# Patient Record
Sex: Male | Born: 1950
Health system: Southern US, Community
[De-identification: ages and names within clinical notes are randomized; demographics above are authoritative.]

## PROBLEM LIST (undated history)

## (undated) DIAGNOSIS — Z87448 Personal history of other diseases of urinary system: Secondary | ICD-10-CM

## (undated) DIAGNOSIS — I10 Essential (primary) hypertension: Secondary | ICD-10-CM

## (undated) DIAGNOSIS — J45909 Unspecified asthma, uncomplicated: Secondary | ICD-10-CM

## (undated) DIAGNOSIS — M1990 Unspecified osteoarthritis, unspecified site: Secondary | ICD-10-CM

## (undated) DIAGNOSIS — E785 Hyperlipidemia, unspecified: Secondary | ICD-10-CM

## (undated) HISTORY — DX: Unspecified asthma, uncomplicated: J45.909

## (undated) HISTORY — DX: Unspecified osteoarthritis, unspecified site: M19.90

## (undated) HISTORY — PX: COLON SURGERY: SHX602

## (undated) HISTORY — DX: Personal history of other diseases of urinary system: Z87.448

## (undated) HISTORY — DX: Hyperlipidemia, unspecified: E78.5

---

## 2012-05-16 ENCOUNTER — Ambulatory Visit (INDEPENDENT_AMBULATORY_CARE_PROVIDER_SITE_OTHER): Payer: BC Managed Care – PPO | Admitting: Emergency Medicine

## 2012-05-16 VITALS — BP 132/79 | HR 85 | Temp 98.5°F | Resp 16 | Ht 65.5 in | Wt 187.0 lb

## 2012-05-16 DIAGNOSIS — M779 Enthesopathy, unspecified: Secondary | ICD-10-CM

## 2012-05-16 MED ORDER — NAPROXEN SODIUM 550 MG PO TABS: 550.0000 mg | ORAL_TABLET | Freq: Two times a day (BID) | ORAL | Status: DC

## 2012-05-16 NOTE — Progress Notes (Signed)
[facility] [address], [place] [address] [phone]  Date:  05/16/2012   Name:  Johnny Sheppard   DOB:  Jul 06, 1950   MRN:  [id number]  PCP:  No primary provider on file.    Chief Complaint: Hand Pain   History of Present Illness:  John[place]ner is a 61 y.o. very pleasant male patient who presents with the following:  Injured his right hand pulling on a garbage can full of scrap metal.  Says he felt something pop and experienced pain.  Now cannot fully close his fist.  There is no problem list on file for this patient.   Past Medical History  Diagnosis Date  . Asthma     History reviewed. No pertinent past surgical history.  History  Substance Use Topics  . Smoking status: Never Smoker   . Smokeless tobacco: Not on file  . Alcohol Use: No    History reviewed. No pertinent family history.  No Known Allergies  Medication list has been reviewed and updated.  Current Outpatient Prescriptions on File Prior to Visit  Medication Sig Dispense Refill  . simvastatin (ZOCOR) 20 MG tablet Take 20 mg by mouth every evening.        Review of Systems:  As per HPI, otherwise negative.    Physical Examination: Filed Vitals:   05/16/12 1510  BP: 132/79  Pulse: 85  Temp: 98.5 F (36.9 C)  Resp: 16   Filed Vitals:   05/16/12 1510  Height: 5' 5.5" (1.664 m)  Weight: 187 lb (84.823 kg)   Body mass index is 30.65 kg/(m^2). Ideal Body Weight: Weight in (lb) to have BMI = 25: 152.2    GEN: WDWN, NAD, Non-toxic, Alert & Oriented x 3 HEENT: Atraumatic, Normocephalic.  Ears and Nose: No external deformity. EXTR: No clubbing/cyanosis/edema NEURO: Normal gait.  PSYCH: Normally interactive. Conversant. Not depressed or anxious appearing.  Calm demeanor.  RIGHT HAND:  Tender over flexor tendon in palm 4th metacarpal.  No crepitus.  Pain with flexion against resistance.  Has full active ROM.    Assessment and Plan: Tendonitis Anaprox Hand  consult Follow up as needed  Carmelina Dane, MD

## 2012-05-30 ENCOUNTER — Ambulatory Visit (INDEPENDENT_AMBULATORY_CARE_PROVIDER_SITE_OTHER): Payer: BC Managed Care – PPO | Admitting: Family Medicine

## 2012-05-30 VITALS — BP 127/81 | HR 78 | Temp 98.7°F | Resp 16 | Ht 65.0 in | Wt 187.2 lb

## 2012-05-30 DIAGNOSIS — E785 Hyperlipidemia, unspecified: Secondary | ICD-10-CM

## 2012-05-30 DIAGNOSIS — R109 Unspecified abdominal pain: Secondary | ICD-10-CM

## 2012-05-30 LAB — POCT UA - MICROSCOPIC ONLY
Casts, Ur, LPF, POC: NEGATIVE
Crystals, Ur, HPF, POC: NEGATIVE
Epithelial cells, urine per micros: NEGATIVE
Mucus, UA: NEGATIVE
WBC, Ur, HPF, POC: NEGATIVE
Yeast, UA: NEGATIVE

## 2012-05-30 LAB — POCT CBC
Granulocyte percent: 50.3 % (ref 37–80)
HCT, POC: 48.3 % (ref 43.5–53.7)
Hemoglobin: 15.7 g/dL (ref 14.1–18.1)
Lymph, poc: 4.1 — AB (ref 0.6–3.4)
MCH, POC: 29.2 pg (ref 27–31.2)
MCHC: 32.5 g/dL (ref 31.8–35.4)
MCV: 90 fL (ref 80–97)
MID (cbc): 0.9 (ref 0–0.9)
MPV: 9.3 fL (ref 0–99.8)
POC Granulocyte: 5 (ref 2–6.9)
POC LYMPH PERCENT: 40.8 % (ref 10–50)
POC MID %: 8.9 %M (ref 0–12)
Platelet Count, POC: 351 10*3/uL (ref 142–424)
RBC: 5.37 M/uL (ref 4.69–6.13)
RDW, POC: 13.1 %
WBC: 10 10*3/uL (ref 4.6–10.2)

## 2012-05-30 LAB — POCT URINALYSIS DIPSTICK
Bilirubin, UA: NEGATIVE
Glucose, UA: NEGATIVE
Leukocytes, UA: NEGATIVE
Nitrite, UA: NEGATIVE
Protein, UA: NEGATIVE
Spec Grav, UA: >=1.03
Urobilinogen, UA: 0.2
pH, UA: 5.5

## 2012-05-30 NOTE — Progress Notes (Signed)
[facility]:  Office Visit  Chief Complaint:  Chief Complaint  Patient presents with  . Flank Pain    been hurting on right side of back for two days    HPI: Johnny Sheppard is a 62 y.o. male who complains of  1 day history of right side flank pain, uncomfortable. Urine with odor. No fevers or chills.  Denies nausea, vomiting. No increase frequency/urgency. Empties completely when goes to bathroom.  Drinking echo water, vitamin water.   Hyperlipidemia-doing well on statins. No SEs. Would like to get labs redone, it has been 1 year since and he has not followed up.   Past Medical History  Diagnosis Date  . Asthma   . Arthritis   . Hyperlipidemia   . H/O hematuria     Followed by urology   Past Surgical History  Procedure Date  . Colon surgery    History   Social History  . Marital Status: Married    Spouse Name: N/A    Number of Children: N/A  . Years of Education: N/A   Social History Main Topics  . Smoking status: Never Smoker   . Smokeless tobacco: None  . Alcohol Use: No  . Drug Use: No  . Sexually Active: Yes   Other Topics Concern  . None   Social History Narrative  . None   History reviewed. No pertinent family history. No Known Allergies Prior to Admission medications   Medication Sig Start Date End Date Taking? Authorizing Provider  aspirin 81 MG tablet Take 81 mg by mouth daily.   Yes Historical Provider, MD  naproxen sodium (ANAPROX DS) 550 MG tablet Take 1 tablet (550 mg total) by mouth 2 (two) times daily with a meal. 05/16/12 05/16/13 Yes Phillips Odor, MD  simvastatin (ZOCOR) 20 MG tablet Take 20 mg by mouth every evening.   Yes Historical Provider, MD     ROS: The patient denies fevers, chills, night sweats, unintentional weight loss, chest pain, palpitations, wheezing, dyspnea on exertion, nausea, vomiting, abdominal pain, dysuria, hematuria, melena, numbness, weakness, or tingling.    All other systems have been  reviewed and were otherwise negative with the exception of those mentioned in the HPI and as above.    PHYSICAL EXAM: Filed Vitals:   05/30/12 1706  BP: 127/81  Pulse: 78  Temp: 98.7 F (37.1 C)  Resp: 16   Filed Vitals:   05/30/12 1706  Height: 5\' 5"  (1.651 m)  Weight: 187 lb 3.2 oz (84.913 kg)   Body mass index is 31.15 kg/(m^2).  General: Alert, no acute distress HEENT:  Normocephalic, atraumatic, oropharynx patent.  Cardiovascular:  Regular rate and rhythm, no rubs murmurs or gallops.  No Carotid bruits, radial pulse intact. No pedal edema.  Respiratory: Clear to auscultation bilaterally.  No wheezes, rales, or rhonchi.  No cyanosis, no use of accessory musculature GI: No organomegaly, abdomen is soft and non-tender, positive bowel sounds.  No masses. Skin: No rashes. Neurologic: Facial musculature symmetric. Psychiatric: Patient is appropriate throughout our interaction. Lymphatic: No cervical lymphadenopathy Musculoskeletal: Gait intact. No CVA tenderness   LABS: Results for orders placed in visit on 05/30/12  POCT UA - MICROSCOPIC ONLY      Component Value Range   WBC, Ur, HPF, POC neg     RBC, urine, microscopic 0-1     Bacteria, U Microscopic trace     Mucus, UA neg     Epithelial cells, urine per micros neg  Crystals, Ur, HPF, POC neg     Casts, Ur, LPF, POC neg     Yeast, UA neg    POCT URINALYSIS DIPSTICK      Component Value Range   Color, UA yellow     Clarity, UA clear     Glucose, UA neg     Bilirubin, UA neg     Ketones, UA trace     Spec Grav, UA >=1.030     Blood, UA trace-lysed     pH, UA 5.5     Protein, UA neg     Urobilinogen, UA 0.2     Nitrite, UA neg     Leukocytes, UA Negative    POCT CBC      Component Value Range   WBC 10.0  4.6 - 10.2 K/uL   Lymph, poc 4.1 (*) 0.6 - 3.4   POC LYMPH PERCENT 40.8  10 - 50 %L   MID (cbc) 0.9  0 - 0.9   POC MID % 8.9  0 - 12 %M   POC Granulocyte 5.0  2 - 6.9   Granulocyte percent 50.3  37 -  80 %G   RBC 5.37  4.69 - 6.13 M/uL   Hemoglobin 15.7  14.1 - 18.1 g/dL   HCT, POC 29.5  28.4 - 53.7 %   MCV 90.0  80 - 97 fL   MCH, POC 29.2  27 - 31.2 pg   MCHC 32.5  31.8 - 35.4 g/dL   RDW, POC 13.2     Platelet Count, POC 351  142 - 424 K/uL   MPV 9.3  0 - 99.8 fL     EKG/XRAY:   Primary read interpreted by Dr. Conley Rolls at [facility].   ASSESSMENT/PLAN: Encounter Diagnoses  Name Primary?  . Flank pain, acute Yes  . Hyperlipidemia    Most likely small kidney stone vs msk sprain and strain, no e/o UTI Will monitor, if sxs worsen then will give cipro 250 mg BID for 3 days Pending labs from today: CMP  He will return for fasting labs for high cholesterol: TSH lipid F/u in 6 months    Johnny Oelkers PHUONG, DO 05/31/2012 12:09 PM

## 2012-05-31 ENCOUNTER — Encounter: Payer: Self-pay | Admitting: Family Medicine

## 2012-05-31 DIAGNOSIS — E785 Hyperlipidemia, unspecified: Secondary | ICD-10-CM | POA: Insufficient documentation

## 2012-05-31 LAB — COMPREHENSIVE METABOLIC PANEL WITH GFR
Albumin: 4.3 g/dL (ref 3.5–5.2)
Alkaline Phosphatase: 75 U/L (ref 39–117)
BUN: 17 mg/dL (ref 6–23)
CO2: 25 meq/L (ref 19–32)
Calcium: 9.6 mg/dL (ref 8.4–10.5)
Chloride: 102 meq/L (ref 96–112)
Glucose, Bld: 98 mg/dL (ref 70–99)
Potassium: 4.6 meq/L (ref 3.5–5.3)
Sodium: 137 meq/L (ref 135–145)
Total Protein: 7.1 g/dL (ref 6.0–8.3)

## 2012-05-31 LAB — COMPREHENSIVE METABOLIC PANEL
ALT: 22 U/L (ref 0–53)
AST: 21 U/L (ref 0–37)
Creat: 1.15 mg/dL (ref 0.50–1.35)
Total Bilirubin: 0.3 mg/dL (ref 0.3–1.2)

## 2012-06-01 ENCOUNTER — Telehealth: Payer: Self-pay | Admitting: Radiology

## 2012-06-01 NOTE — Telephone Encounter (Signed)
Message copied by Caffie Damme on Mon Jun 01, 2012  6:14 PM ------      Message from: Hamilton Capri P      Created: Mon Jun 01, 2012 10:28 AM       He has normal liver, kidney, and GB and electrolytes. Hopefully he is better if not then need to let me know. Has standing orders for fasting labs to be done at his convenience in next 1-2 weeks.             Tle

## 2012-06-04 ENCOUNTER — Other Ambulatory Visit (INDEPENDENT_AMBULATORY_CARE_PROVIDER_SITE_OTHER): Payer: BC Managed Care – PPO | Admitting: Family Medicine

## 2012-06-04 DIAGNOSIS — E785 Hyperlipidemia, unspecified: Secondary | ICD-10-CM

## 2012-06-04 LAB — LIPID PANEL
Cholesterol: 140 mg/dL (ref 0–200)
HDL: 40 mg/dL (ref >39)
LDL Cholesterol: 87 mg/dL (ref 0–99)
Total CHOL/HDL Ratio: 3.5 ratio
Triglycerides: 66 mg/dL (ref <150)
VLDL: 13 mg/dL (ref 0–40)

## 2012-06-04 LAB — TSH: TSH: 0.792 u[IU]/mL (ref 0.350–4.500)

## 2012-06-09 ENCOUNTER — Telehealth: Payer: Self-pay | Admitting: Family Medicine

## 2012-06-09 NOTE — Telephone Encounter (Signed)
LM that cholesterol and TSH were normal

## 2012-06-15 NOTE — Telephone Encounter (Signed)
I have left message for patient, and you have as well, patient has not returned for fasting labs, do you want me to send a letter?

## 2012-06-22 ENCOUNTER — Telehealth: Payer: Self-pay

## 2012-06-22 NOTE — Telephone Encounter (Signed)
WAS TOLD THAT PHARMACY SENT IN REQUEST FOR ZOCOR BUT HE SAID THE LAST TIME IT WAS SENT WAS 06/19/12 BEST NUMBER TO REACH IS ([phone]). WAS TOLD BY DR. Conley Rolls THAT IT WOULD BE FILLED FROM HIS LAST OFFICE VISIT ON THE 11TH. THANK YOU!

## 2012-06-23 MED ORDER — SIMVASTATIN 20 MG PO TABS: 20.0000 mg | ORAL_TABLET | Freq: Every evening | ORAL | Status: DC

## 2012-06-23 NOTE — Telephone Encounter (Signed)
Sending in 6 mos of RFs on Zocor and notified pt on VM.

## 2013-02-24 ENCOUNTER — Other Ambulatory Visit: Payer: Self-pay | Admitting: *Deleted

## 2013-02-24 MED ORDER — SIMVASTATIN 20 MG PO TABS: 20.0000 mg | ORAL_TABLET | Freq: Every evening | ORAL | Status: DC

## 2013-02-24 NOTE — Telephone Encounter (Signed)
Advised pt that he must follow up as as possible.  Pt states that he will follow up soon.

## 2013-03-03 ENCOUNTER — Ambulatory Visit: Payer: Self-pay | Admitting: Family Medicine

## 2013-03-03 VITALS — BP 118/76 | HR 70 | Temp 98.4°F | Resp 18 | Ht 65.0 in | Wt 181.0 lb

## 2013-03-03 DIAGNOSIS — Z0289 Encounter for other administrative examinations: Secondary | ICD-10-CM

## 2013-03-03 NOTE — Progress Notes (Signed)
SP GR-1.015, PROTEIN-NEG, BLOOD-NEG, GLUCOSE-NEG    [facility]:  Office Visit  Chief Complaint:  Chief Complaint  Patient presents with  . Employment Physical    DOT     HPI: Johnny Sheppard is a 62 y.o. male who is here for DOT PE. He denies having DM, MI, HTN, OSA He has XOL on zocor  He is doing well No SI/HI/hallucinations  Past Medical History  Diagnosis Date  . Asthma   . Arthritis   . Hyperlipidemia   . H/O hematuria     Followed by urology   Past Surgical History  Procedure Laterality Date  . Colon surgery     History   Social History  . Marital Status: Married    Spouse Name: N/A    Number of Children: N/A  . Years of Education: N/A   Social History Main Topics  . Smoking status: Never Smoker   . Smokeless tobacco: None  . Alcohol Use: No  . Drug Use: No  . Sexual Activity: Yes   Other Topics Concern  . None   Social History Narrative  . None   Family History  Problem Relation Age of Onset  . Hypertension Sister    No Known Allergies Prior to Admission medications   Medication Sig Start Date End Date Taking? Authorizing Provider  aspirin 81 MG tablet Take 81 mg by mouth daily.   Yes Historical Provider, MD  simvastatin (ZOCOR) 20 MG tablet Take 1 tablet (20 mg total) by mouth every evening. Needs office visit 02/24/13  Yes Chelle S Jeffery, PA-C  naproxen sodium (ANAPROX DS) 550 MG tablet Take 1 tablet (550 mg total) by mouth 2 (two) times daily with a meal. 05/16/12 05/16/13  Phillips Odor, MD     ROS: The patient denies fevers, chills, night sweats, unintentional weight loss, chest pain, palpitations, wheezing, dyspnea on exertion, nausea, vomiting, abdominal pain, dysuria, hematuria, melena, numbness, weakness, or tingling.   All other systems have been reviewed and were otherwise negative with the exception of those mentioned in the HPI and as above.    PHYSICAL EXAM: Filed Vitals:   03/03/13 0822  BP: 118/76   Pulse: 70  Temp: 98.4 F (36.9 C)  Resp: 18   Filed Vitals:   03/03/13 0822  Height: 5\' 5"  (1.651 m)  Weight: 181 lb (82.101 kg)   Body mass index is 30.12 kg/(m^2).  General: Alert, no acute distress HEENT:  Normocephalic, atraumatic, oropharynx patent. EOMI, PERRLA Cardiovascular:  Regular rate and rhythm, no rubs murmurs or gallops.  No Carotid bruits, radial pulse intact. No pedal edema.  Respiratory: Clear to auscultation bilaterally.  No wheezes, rales, or rhonchi.  No cyanosis, no use of accessory musculature GI: No organomegaly, abdomen is soft and non-tender, positive bowel sounds.  No masses. Skin: No rashes. Neurologic: Facial musculature symmetric. Psychiatric: Patient is appropriate throughout our interaction. Lymphatic: No cervical lymphadenopathy Musculoskeletal: Gait intact. Full ROM, 5/5 strength, sensation intact No inguinal hernia   LABS: Results for orders placed in visit on 06/04/12  TSH      Result Value Range   TSH 0.792  0.350 - 4.500 uIU/mL  LIPID PANEL      Result Value Range   Cholesterol 140  0 - 200 mg/dL   Triglycerides 66  <829 mg/dL   HDL 40  >56 mg/dL   Total CHOL/HDL Ratio 3.5     VLDL 13  0 - 40 mg/dL   LDL Cholesterol 87  0 - 99 mg/dL     EKG/XRAY:   Primary read interpreted by Dr. Conley Rolls at [facility].   ASSESSMENT/PLAN: Encounter Diagnosis  Name Primary?  . Health examination of defined subpopulation Yes   DOT PE 2 year recert Gross sideeffects, risk and benefits, and alternatives of medications d/w patient. Patient is aware that all medications have potential sideeffects and we are unable to predict every sideeffect or drug-drug interaction that may occur.  LE, THAO PHUONG, DO 03/03/2013 9:15 AM

## 2013-04-04 ENCOUNTER — Telehealth: Payer: Self-pay

## 2013-04-04 MED ORDER — SIMVASTATIN 20 MG PO TABS: 20.0000 mg | ORAL_TABLET | Freq: Every evening | ORAL | Status: DC

## 2013-04-04 NOTE — Telephone Encounter (Signed)
PT WOULD LIKE TO HAVE A REFILL ON SIMVASTATIN, HE HAS ONE PILL LEFT. BEST# [phone] [facility] [place]

## 2013-05-05 ENCOUNTER — Ambulatory Visit: Payer: BC Managed Care – PPO

## 2013-05-05 ENCOUNTER — Ambulatory Visit (INDEPENDENT_AMBULATORY_CARE_PROVIDER_SITE_OTHER): Payer: BC Managed Care – PPO | Admitting: Family Medicine

## 2013-05-05 VITALS — BP 142/82 | HR 71 | Temp 98.3°F | Resp 17 | Ht 65.0 in | Wt 186.0 lb

## 2013-05-05 DIAGNOSIS — M545 Low back pain, unspecified: Secondary | ICD-10-CM

## 2013-05-05 DIAGNOSIS — M549 Dorsalgia, unspecified: Secondary | ICD-10-CM

## 2013-05-05 LAB — POCT URINALYSIS DIPSTICK
Bilirubin, UA: NEGATIVE
Glucose, UA: NEGATIVE
Leukocytes, UA: NEGATIVE
Nitrite, UA: NEGATIVE
Protein, UA: NEGATIVE
Spec Grav, UA: >=1.03
Urobilinogen, UA: 1
pH, UA: 6

## 2013-05-05 LAB — POCT UA - MICROSCOPIC ONLY
Casts, Ur, LPF, POC: NEGATIVE
Crystals, Ur, HPF, POC: NEGATIVE
Yeast, UA: NEGATIVE

## 2013-05-05 MED ORDER — PREDNISONE 20 MG PO TABS: ORAL_TABLET | ORAL | Status: DC

## 2013-05-05 MED ORDER — CYCLOBENZAPRINE HCL 5 MG PO TABS: 5.0000 mg | ORAL_TABLET | Freq: Every day | ORAL | Status: DC

## 2013-05-05 NOTE — Progress Notes (Signed)
This is a 62 y.o.male who complains of gradual onset lower back pain that started 2-3 days ago. He denies any recent injury, but states he does do a lot of heavy lifting Character of pain: Achy Location of pain:  Lower back pain Radiation of pain:  He states the pain radiates to his LLQ Onset associated with:  Heavy lifting Patient has not a past history of low back pain  [place] Cl[place] denies any urinary symptoms, bowel problems, numbness in the legs, chills, loss of motor power. [place] had no fever.  Woodfin Ganja has tried 600 mg ibuprofen with mild relief.   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 Asthma   . Arthritis   . Hyperlipidemia   . H/O hematuria     Followed by urology     Past Surgical History  Procedure Laterality Date  . Colon surgery      Objective:  middle-aged male in no acute distress. Blood pressure 142/82, pulse 71, temperature 98.3 F (36.8 C), temperature source Oral, resp. rate 17, height 5\' 5"  (1.651 m), weight 186 lb (84.369 kg), SpO2 100.00%.Body mass index is 30.95 kg/(m^2). Palpation of the back reveals no localized tenderness CVA:  nontender Abdomen: soft, nontender Peripheral pulses:  DP/PT good Inspection of the back: Reveals no scoliosis Straight-leg raising: positive weakly on left Motor exam of lower extremity: No abnormal weakness. Reflexes: Symmetric and normal Skin exam: normal Results for orders placed in visit on 05/05/13  POCT UA - MICROSCOPIC ONLY      Result Value Range   WBC, Ur, HPF, POC 0-1     RBC, urine, microscopic 4-6     Bacteria, U Microscopic trace     Mucus, UA trace     Epithelial cells, urine per micros 0-2     Crystals, Ur, HPF, POC neg     Casts, Ur, LPF, POC neg     Yeast, UA neg    POCT URINALYSIS DIPSTICK      Result Value Range   Color, UA yellow     Clarity, UA clear     Glucose, UA neg     Bilirubin, UA neg     Ketones, UA trace     Spec Grav,  UA >=1.030     Blood, UA trace     pH, UA 6.0     Protein, UA neg     Urobilinogen, UA 1.0     Nitrite, UA neg     Leukocytes, UA Negative     [facility] reading (PRIMARY) by  Dr. Milus Glazier:  L/S spine films.  Negative except for some age related spondylosis    Assessment/Plan: Acute lower back pain without acute neurological findings. Back pain - Plan: DG Lumbar Spine 2-3 Views, POCT UA - Microscopic Only, POCT urinalysis dipstick, predniSONE (DELTASONE) 20 MG tablet, cyclobenzaprine (FLEXERIL) 5 MG tablet  Signed, Elvina Sidle, MD

## 2013-05-05 NOTE — Patient Instructions (Signed)
Back Pain, Adult Low back pain is very common. About 1 in 5 people have back pain.The cause of low back pain is rarely dangerous. The pain often gets better over time.About half of people with a sudden onset of back pain feel better in just 2 weeks. About 8 in 10 people feel better by 6 weeks.  CAUSES Some common causes of back pain include:  Strain of the muscles or ligaments supporting the spine.  Wear and tear (degeneration) of the spinal discs.  Arthritis.  Direct injury to the back. DIAGNOSIS Most of the time, the direct cause of low back pain is not known.However, back pain can be treated effectively even when the exact cause of the pain is unknown.Answering your caregiver's questions about your overall health and symptoms is one of the most accurate ways to make sure the cause of your pain is not dangerous. If your caregiver needs more information, he or she may order lab work or imaging tests (X-rays or MRIs).However, even if imaging tests show changes in your back, this usually does not require surgery. HOME CARE INSTRUCTIONS For many people, back pain returns.Since low back pain is rarely dangerous, it is often a condition that people can learn to manageon their own.   Remain active. It is stressful on the back to sit or stand in one place. Do not sit, drive, or stand in one place for more than 30 minutes at a time. Take short walks on level surfaces as soon as pain allows.Try to increase the length of time you walk each day.  Do not stay in bed.Resting more than 1 or 2 days can delay your recovery.  Do not avoid exercise or work.Your body is made to move.It is not dangerous to be active, even though your back may hurt.Your back will likely heal faster if you return to being active before your pain is gone.  Pay attention to your body when you bend and lift. Many people have less discomfortwhen lifting if they bend their knees, keep the load close to their bodies,and  avoid twisting. Often, the most comfortable positions are those that put less stress on your recovering back.  Find a comfortable position to sleep. Use a firm mattress and lie on your side with your knees slightly bent. If you lie on your back, put a pillow under your knees.  Only take over-the-counter or prescription medicines as directed by your caregiver. Over-the-counter medicines to reduce pain and inflammation are often the most helpful.Your caregiver may prescribe muscle relaxant drugs.These medicines help dull your pain so you can more quickly return to your normal activities and healthy exercise.  Put ice on the injured area.  Put ice in a plastic bag.  Place a towel between your skin and the bag.  Leave the ice on for 15-20 minutes, 03-04 times a day for the first 2 to 3 days. After that, ice and heat may be alternated to reduce pain and spasms.  Ask your caregiver about trying back exercises and gentle massage. This may be of some benefit.  Avoid feeling anxious or stressed.Stress increases muscle tension and can worsen back pain.It is important to recognize when you are anxious or stressed and learn ways to manage it.Exercise is a great option. SEEK MEDICAL CARE IF:  You have pain that is not relieved with rest or medicine.  You have pain that does not improve in 1 week.  You have new symptoms.  You are generally not feeling well. SEEK   IMMEDIATE MEDICAL CARE IF:   You have pain that radiates from your back into your legs.  You develop new bowel or bladder control problems.  You have unusual weakness or numbness in your arms or legs.  You develop nausea or vomiting.  You develop abdominal pain.  You feel faint. Document Released: 05/06/2005 Document Revised: 11/05/2011 Document Reviewed: 09/24/2010 Exit[employer] Patient Information 2014 [employer].  

## 2013-07-21 ENCOUNTER — Other Ambulatory Visit: Payer: Self-pay

## 2013-07-21 NOTE — Telephone Encounter (Signed)
Patient says he wants a refill for his cholesterol medication again, he has no refills and says he is unable to come into office for a visit. He says that Dr. Marin Comment has refilled before without an ov. Please refill thank you!  Best: [phone]

## 2013-07-22 ENCOUNTER — Other Ambulatory Visit: Payer: Self-pay | Admitting: Family Medicine

## 2013-07-22 NOTE — Telephone Encounter (Signed)
Patient wants to know status of refill request. He wants his zocor refilled but I told him we are still waiting on a response from Dr. Marin Comment. Please advise if he needs an ov.

## 2013-07-22 NOTE — Telephone Encounter (Signed)
Dr Marin Comment, pt has been in for back pain and a DOT PE, but hasn't had lipid panel since 05/2012. Can we RF?

## 2013-07-23 MED ORDER — SIMVASTATIN 20 MG PO TABS: 20.0000 mg | ORAL_TABLET | Freq: Every evening | ORAL | Status: DC

## 2013-07-24 ENCOUNTER — Ambulatory Visit (INDEPENDENT_AMBULATORY_CARE_PROVIDER_SITE_OTHER): Payer: BC Managed Care – PPO | Admitting: Family Medicine

## 2013-07-24 VITALS — BP 110/70 | HR 60 | Temp 98.2°F | Resp 16 | Ht 65.0 in | Wt 180.0 lb

## 2013-07-24 DIAGNOSIS — Z0000 Encounter for general adult medical examination without abnormal findings: Secondary | ICD-10-CM

## 2013-07-24 DIAGNOSIS — Z125 Encounter for screening for malignant neoplasm of prostate: Secondary | ICD-10-CM

## 2013-07-24 DIAGNOSIS — E785 Hyperlipidemia, unspecified: Secondary | ICD-10-CM

## 2013-07-24 DIAGNOSIS — Z87448 Personal history of other diseases of urinary system: Secondary | ICD-10-CM

## 2013-07-24 LAB — POCT CBC
Granulocyte percent: 56.4 % (ref 37–80)
HCT, POC: 47.8 % (ref 43.5–53.7)
Hemoglobin: 16 g/dL (ref 14.1–18.1)
Lymph, poc: 3.5 — AB (ref 0.6–3.4)
MCH, POC: 29.9 pg (ref 27–31.2)
MCHC: 33.5 g/dL (ref 31.8–35.4)
MCV: 89.2 fL (ref 80–97)
MID (cbc): 0.8 (ref 0–0.9)
MPV: 9.3 fL (ref 0–99.8)
POC Granulocyte: 5.5 (ref 2–6.9)
POC LYMPH PERCENT: 35.5 %L (ref 10–50)
POC MID %: 8.1 %M (ref 0–12)
Platelet Count, POC: 337 10*3/uL (ref 142–424)
RBC: 5.36 M/uL (ref 4.69–6.13)
RDW, POC: 14 %
WBC: 9.8 10*3/uL (ref 4.6–10.2)

## 2013-07-24 LAB — POCT URINALYSIS DIPSTICK
Bilirubin, UA: NEGATIVE
Glucose, UA: NEGATIVE
Ketones, UA: NEGATIVE
Leukocytes, UA: NEGATIVE
Nitrite, UA: NEGATIVE
Protein, UA: NEGATIVE
Spec Grav, UA: >=1.03
Urobilinogen, UA: 0.2
pH, UA: 5.5

## 2013-07-24 LAB — IFOBT (OCCULT BLOOD): IFOBT: NEGATIVE

## 2013-07-24 LAB — POCT UA - MICROSCOPIC ONLY
Bacteria, U Microscopic: NEGATIVE
Casts, Ur, LPF, POC: NEGATIVE
Crystals, Ur, HPF, POC: NEGATIVE
Epithelial cells, urine per micros: NEGATIVE
Mucus, UA: NEGATIVE
WBC, Ur, HPF, POC: NEGATIVE
Yeast, UA: NEGATIVE

## 2013-07-24 MED ORDER — SIMVASTATIN 20 MG PO TABS: 20.0000 mg | ORAL_TABLET | Freq: Every evening | ORAL | Status: DC

## 2013-07-24 NOTE — Progress Notes (Signed)
Chief Complaint:  Chief Complaint  Patient presents with  . Annual Exam    HPI: Johnny Sheppard is a 63 y.o. male who is here for  Doing well  No complaints Still driving a truck He has not had any blood in his urine Does not want flu vaccine UTD on colonscopy Has high cholesterol, he denies any SEs, has been taking his meds regular  Past Medical History  Diagnosis Date  . Asthma   . Arthritis   . Hyperlipidemia   . H/O hematuria     Followed by urology   Past Surgical History  Procedure Laterality Date  . Colon surgery      2012, had 1 polyp, Dr Benson [place]   History   Social History  . Marital Status: Married    Spouse Name: N/A    Number of Children: N/A  . Years of Education: N/A   Social History Main Topics  . Smoking status: Never Smoker   . Smokeless tobacco: None  . Alcohol Use: No  . Drug Use: No  . Sexual Activity: Yes   Other Topics Concern  . None   Social History Narrative  . None   Family History  Problem Relation Age of Onset  . Hypertension Sister    No Known Allergies Prior to Admission medications   Medication Sig Start Date End Date Taking? Authorizing Provider  aspirin 81 MG tablet Take 81 mg by mouth daily.   Yes Historical Provider, MD  simvastatin (ZOCOR) 20 MG tablet Take 1 tablet (20 mg total) by mouth every evening. PATIENT NEEDS OFFICE VISIT FOR ADDITIONAL REFILLS   Yes Ryan M Dunn, PA-C  cyclobenzaprine (FLEXERIL) 5 MG tablet Take 1 tablet (5 mg total) by mouth at bedtime. 05/05/13   Robyn Haber, MD     ROS: The patient denies fevers, chills, night sweats, unintentional weight loss, chest pain, palpitations, wheezing, dyspnea on exertion, nausea, vomiting, abdominal pain, dysuria, hematuria, melena, numbness, weakness, or tingling.   All other systems have been reviewed and were otherwise negative with the exception of those mentioned in the HPI and as above.    PHYSICAL EXAM: Filed Vitals:   07/24/13 1421  BP:  110/70  Pulse: 60  Temp: 98.2 F (36.8 C)  Resp: 16   Filed Vitals:   07/24/13 1421  Height: 5\' 5"  (1.651 m)  Weight: 180 lb (81.647 kg)   Body mass index is 29.95 kg/(m^2).  General: Alert, no acute distress HEENT:  Normocephalic, atraumatic, oropharynx patent. EOMI, PERRLA, fundo exam nl, tm nl  Cardiovascular:  Regular rate and rhythm, no rubs murmurs or gallops.  No Carotid bruits, radial pulse intact. No pedal edema.  Respiratory: Clear to auscultation bilaterally.  No wheezes, rales, or rhonchi.  No cyanosis, no use of accessory musculature GI: No organomegaly, abdomen is soft and non-tender, positive bowel sounds.  No masses. Skin: No rashes. Neurologic: Facial musculature symmetric. Psychiatric: Patient is appropriate throughout our interaction. Lymphatic: No cervical lymphadenopathy Musculoskeletal: Gait intact. 5/5 streength, 2/2 DTRs Gu-normal Rectal exam-nl, prostate nl   LABS: Results for orders placed in visit on 07/24/13  POCT CBC      Result Value Ref Range   WBC 9.8  4.6 - 10.2 K/uL   Lymph, poc 3.5 (*) 0.6 - 3.4   POC LYMPH PERCENT 35.5  10 - 50 %L   MID (cbc) 0.8  0 - 0.9   POC MID % 8.1  0 - 12 %M  POC Granulocyte 5.5  2 - 6.9   Granulocyte percent 56.4  37 - 80 %G   RBC 5.36  4.69 - 6.13 M/uL   Hemoglobin 16.0  14.1 - 18.1 g/dL   HCT, POC 47.8  43.5 - 53.7 %   MCV 89.2  80 - 97 fL   MCH, POC 29.9  27 - 31.2 pg   MCHC 33.5  31.8 - 35.4 g/dL   RDW, POC 14.0     Platelet Count, POC 337  142 - 424 K/uL   MPV 9.3  0 - 99.8 fL  IFOBT (OCCULT BLOOD)      Result Value Ref Range   IFOBT Negative    POCT URINALYSIS DIPSTICK      Result Value Ref Range   Color, UA yellow     Clarity, UA clear     Glucose, UA neg     Bilirubin, UA neg     Ketones, UA neg     Spec Grav, UA >=1.030     Blood, UA small     pH, UA 5.5     Protein, UA neg     Urobilinogen, UA 0.2     Nitrite, UA neg     Leukocytes, UA Negative    POCT UA - MICROSCOPIC ONLY       Result Value Ref Range   WBC, Ur, HPF, POC neg     RBC, urine, microscopic 0-4     Bacteria, U Microscopic neg     Mucus, UA neg     Epithelial cells, urine per micros neg     Crystals, Ur, HPF, POC neg     Casts, Ur, LPF, POC neg     Yeast, UA neg       EKG/XRAY:   Primary read interpreted by Dr. Marin Comment at [facility].   ASSESSMENT/PLAN: Encounter Diagnoses  Name Primary?  . Routine general medical examination at a health care facility Yes  . Other and unspecified hyperlipidemia   . Prostate cancer screening   . History of hematuria    He has seen Dr Myriam Jacobson for urology x 1 due to prior h/o hematuria, he did not have any at that time He still has hematuria on today's visit He will drop off some urine when he is not fasting to see if he has blood in his urine he thinks it maybe related  Labs pending F/u prn   Gross sideeffects, risk and benefits, and alternatives of medications d/w patient. Patient is aware that all medications have potential sideeffects and we are unable to predict every sideeffect or drug-drug interaction that may occur.  Aseret Hoffman, [place], DO 07/24/2013 4:27 PM

## 2013-07-25 LAB — COMPREHENSIVE METABOLIC PANEL
AST: 24 U/L (ref 0–37)
Alkaline Phosphatase: 74 U/L (ref 39–117)
BUN: 12 mg/dL (ref 6–23)
Glucose, Bld: 74 mg/dL (ref 70–99)
Sodium: 142 mEq/L (ref 135–145)
Total Bilirubin: 0.8 mg/dL (ref 0.2–1.2)
Total Protein: 7.5 g/dL (ref 6.0–8.3)

## 2013-07-25 LAB — COMPREHENSIVE METABOLIC PANEL WITH GFR
ALT: 25 U/L (ref 0–53)
Albumin: 4.7 g/dL (ref 3.5–5.2)
CO2: 28 meq/L (ref 19–32)
Calcium: 9.7 mg/dL (ref 8.4–10.5)
Chloride: 106 meq/L (ref 96–112)
Creat: 1.16 mg/dL (ref 0.50–1.35)
Potassium: 4.5 meq/L (ref 3.5–5.3)

## 2013-07-25 LAB — PSA: PSA: 0.48 ng/mL (ref <=4.00)

## 2013-07-25 LAB — LIPID PANEL
Cholesterol: 157 mg/dL (ref 0–200)
HDL: 45 mg/dL (ref >39)
LDL Cholesterol: 97 mg/dL (ref 0–99)
Total CHOL/HDL Ratio: 3.5 Ratio
Triglycerides: 77 mg/dL (ref <150)
VLDL: 15 mg/dL (ref 0–40)

## 2013-07-25 LAB — TSH: TSH: 1.254 u[IU]/mL (ref 0.350–4.500)

## 2013-07-27 NOTE — Telephone Encounter (Signed)
Pt notified per Dr. Gus Puma note to RTC in 2 months for fasting labs

## 2013-07-29 ENCOUNTER — Encounter: Payer: Self-pay | Admitting: Family Medicine

## 2013-10-31 ENCOUNTER — Ambulatory Visit (INDEPENDENT_AMBULATORY_CARE_PROVIDER_SITE_OTHER): Payer: BC Managed Care – PPO

## 2013-10-31 ENCOUNTER — Ambulatory Visit (INDEPENDENT_AMBULATORY_CARE_PROVIDER_SITE_OTHER): Payer: BC Managed Care – PPO | Admitting: Family Medicine

## 2013-10-31 VITALS — BP 140/84 | HR 62 | Temp 98.0°F | Resp 16 | Ht 64.0 in | Wt 183.0 lb

## 2013-10-31 DIAGNOSIS — R109 Unspecified abdominal pain: Secondary | ICD-10-CM

## 2013-10-31 LAB — POCT URINALYSIS DIPSTICK
Bilirubin, UA: NEGATIVE
Glucose, UA: NEGATIVE
Ketones, UA: NEGATIVE
Leukocytes, UA: NEGATIVE
Nitrite, UA: NEGATIVE
Spec Grav, UA: 1.02
Urobilinogen, UA: 1
pH, UA: 5.5

## 2013-10-31 LAB — POCT UA - MICROSCOPIC ONLY
Casts, Ur, LPF, POC: NEGATIVE
Crystals, Ur, HPF, POC: NEGATIVE
Yeast, UA: NEGATIVE
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
## 2014-05-10 ENCOUNTER — Ambulatory Visit (INDEPENDENT_AMBULATORY_CARE_PROVIDER_SITE_OTHER): Payer: BC Managed Care – PPO | Admitting: Family Medicine

## 2014-05-10 VITALS — BP 130/76 | HR 86 | Temp 99.2°F | Resp 18 | Ht 65.5 in | Wt 185.6 lb

## 2014-05-10 DIAGNOSIS — J00 Acute nasopharyngitis [common cold]: Secondary | ICD-10-CM

## 2014-05-10 DIAGNOSIS — E7800 Pure hypercholesterolemia, unspecified: Secondary | ICD-10-CM

## 2014-05-10 DIAGNOSIS — R509 Fever, unspecified: Secondary | ICD-10-CM

## 2014-05-10 LAB — POCT CBC
GRANULOCYTE PERCENT: 71 % (ref 37–80)
HEMATOCRIT: 52.3 % (ref 43.5–53.7)
Hemoglobin: 17.1 g/dL (ref 14.1–18.1)
Lymph, poc: 2.4 (ref 0.6–3.4)
MCH, POC: 28.8 pg (ref 27–31.2)
MCHC: 32.6 g/dL (ref 31.8–35.4)
MCV: 88.4 fL (ref 80–97)
MID (CBC): 0.5 (ref 0–0.9)
MPV: 7.5 fL (ref 0–99.8)
PLATELET COUNT, POC: 256 10*3/uL (ref 142–424)
POC Granulocyte: 7.1 — AB (ref 2–6.9)
POC LYMPH PERCENT: 23.6 %L (ref 10–50)
POC MID %: 5.4 %M (ref 0–12)
RBC: 5.92 M/uL (ref 4.69–6.13)
RDW, POC: 12.9 %
WBC: 10 10*3/uL (ref 4.6–10.2)

## 2014-05-10 LAB — COMPREHENSIVE METABOLIC PANEL
ALBUMIN: 4.5 g/dL (ref 3.5–5.2)
ALT: 24 U/L (ref 0–53)
AST: 20 U/L (ref 0–37)
Alkaline Phosphatase: 83 U/L (ref 39–117)
BUN: 9 mg/dL (ref 6–23)
CALCIUM: 10.1 mg/dL (ref 8.4–10.5)
CHLORIDE: 100 meq/L (ref 96–112)
CO2: 29 mEq/L (ref 19–32)
Creat: 1.16 mg/dL (ref 0.50–1.35)
Glucose, Bld: 100 mg/dL — ABNORMAL HIGH (ref 70–99)
POTASSIUM: 4.7 meq/L (ref 3.5–5.3)
Sodium: 138 mEq/L (ref 135–145)
Total Bilirubin: 0.5 mg/dL (ref 0.2–1.2)
Total Protein: 7.7 g/dL (ref 6.0–8.3)

## 2014-05-10 LAB — LIPID PANEL
CHOL/HDL RATIO: 4.5 ratio
Cholesterol: 166 mg/dL (ref 0–200)
HDL: 37 mg/dL — ABNORMAL LOW (ref >39)
LDL CALC: 105 mg/dL — AB (ref 0–99)
TRIGLYCERIDES: 122 mg/dL (ref <150)
VLDL: 24 mg/dL (ref 0–40)

## 2014-05-10 MED ORDER — AZITHROMYCIN 250 MG PO TABS: ORAL_TABLET | ORAL | Status: DC

## 2014-05-10 MED ORDER — IPRATROPIUM BROMIDE 0.03 % NA SOLN: 2.0000 | Freq: Four times a day (QID) | NASAL | Status: DC

## 2014-05-10 NOTE — Patient Instructions (Addendum)
Upper Respiratory Infection, Adult An upper respiratory infection (URI) is also sometimes known as the common cold. The upper respiratory tract includes the nose, sinuses, throat, trachea, and bronchi. Bronchi are the airways leading to the lungs. Most people improve within 1 week, but symptoms can last up to 2 weeks. A residual cough may last even longer.  CAUSES Many different viruses can infect the tissues lining the upper respiratory tract. The tissues become irritated and inflamed and often become very moist. Mucus production is also common. A cold is contagious. You can easily spread the virus to others by oral contact. This includes kissing, sharing a glass, coughing, or sneezing. Touching your mouth or nose and then touching a surface, which is then touched by another person, can also spread the virus. SYMPTOMS  Symptoms typically develop 1 to 3 days after you come in contact with a cold virus. Symptoms vary from person to person. They may include:  Runny nose.  Sneezing.  Nasal congestion.  Sinus irritation.  Sore throat.  Loss of voice (laryngitis).  Cough.  Fatigue.  Muscle aches.  Loss of appetite.  Headache.  Low-grade fever. DIAGNOSIS  You might diagnose your own cold based on familiar symptoms, since most people get a cold 2 to 3 times a year. Your caregiver can confirm this based on your exam. Most importantly, your caregiver can check that your symptoms are not due to another disease such as strep throat, sinusitis, pneumonia, asthma, or epiglottitis. Blood tests, throat tests, and X-rays are not necessary to diagnose a common cold, but they may sometimes be helpful in excluding other more serious diseases. Your caregiver will decide if any further tests are required. RISKS AND COMPLICATIONS  You may be at risk for a more severe case of the common cold if you smoke cigarettes, have chronic heart disease (such as heart failure) or lung disease (such as asthma), or if  you have a weakened immune system. The very young and very old are also at risk for more serious infections. Bacterial sinusitis, middle ear infections, and bacterial pneumonia can complicate the common cold. The common cold can worsen asthma and chronic obstructive pulmonary disease (COPD). Sometimes, these complications can require emergency medical care and may be life-threatening. PREVENTION  The best way to protect against getting a cold is to practice good hygiene. Avoid oral or hand contact with people with cold symptoms. Wash your hands often if contact occurs. There is no clear evidence that vitamin C, vitamin E, echinacea, or exercise reduces the chance of developing a cold. However, it is always recommended to get plenty of rest and practice good nutrition. TREATMENT  Treatment is directed at relieving symptoms. There is no cure. Antibiotics are not effective, because the infection is caused by a virus, not by bacteria. Treatment may include:  Increased fluid intake. Sports drinks offer valuable electrolytes, sugars, and fluids.  Breathing heated mist or steam (vaporizer or shower).  Eating chicken soup or other clear broths, and maintaining good nutrition.  Getting plenty of rest.  Using gargles or lozenges for comfort.  Controlling fevers with ibuprofen or acetaminophen as directed by your caregiver.  Increasing usage of your inhaler if you have asthma. Zinc gel and zinc lozenges, taken in the first 24 hours of the common cold, can shorten the duration and lessen the severity of symptoms. Pain medicines may help with fever, muscle aches, and throat pain. A variety of non-prescription medicines are available to treat congestion and runny nose. Your caregiver   can make recommendations and may suggest nasal or lung inhalers for other symptoms.  HOME CARE INSTRUCTIONS   Only take over-the-counter or prescription medicines for pain, discomfort, or fever as directed by your  caregiver.  Use a warm mist humidifier or inhale steam from a shower to increase air moisture. This may keep secretions moist and make it easier to breathe.  Drink enough water and fluids to keep your urine clear or pale yellow.  Rest as needed.  Return to work when your temperature has returned to normal or as your caregiver advises. You may need to stay home longer to avoid infecting others. You can also use a face mask and careful hand washing to prevent spread of the virus. SEEK MEDICAL CARE IF:   After the first few days, you feel you are getting worse rather than better.  You need your caregiver's advice about medicines to control symptoms.  You develop chills, worsening shortness of breath, or brown or red sputum. These may be signs of pneumonia.  You develop yellow or brown nasal discharge or pain in the face, especially when you bend forward. These may be signs of sinusitis.  You develop a fever, swollen neck glands, pain with swallowing, or white areas in the back of your throat. These may be signs of strep throat. SEEK IMMEDIATE MEDICAL CARE IF:   You have a fever.  You develop severe or persistent headache, ear pain, sinus pain, or chest pain.  You develop wheezing, a prolonged cough, cough up blood, or have a change in your usual mucus (if you have chronic lung disease).  You develop sore muscles or a stiff neck. Document Released: 10/30/2000 Document Revised: 07/29/2011 Document Reviewed: 08/11/2013 [facility] Patient Information 2015 [place], [place]. This information is not intended to replace advice given to you by your health care provider. Make sure you discuss any questions you have with your health care provider.   Drink plenty of fluids and get enough rest  If you're not doing better in the next 3 days then go ahead and take a round of antibiotics. If you do not need them just tear of the prescription  Return if worse

## 2014-05-10 NOTE — Progress Notes (Signed)
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 He does not smoke. He has a history of high cholesterol and wants that rechecked as well as his liver enzymes being recheck. He has had a cold for the last 5 days or so. He has had head congestion and then developed sore throat about 3 days ago. He coughs little bit. He has a lot of postnasal drainage. His wife has not been ill. Is not documented having a elevated fever.  Objective: Temperature 99.2. His TMs are normal. Nose congested. Throat not erythematous. Neck supple without significant nodes. Chest clear to auscultation. Heart regular without murmurs gallops or arrhythmias.  Assessment: Hyperlipidemia Common cold with upper respiratory congestion, sore throat, and mild cough.  Plan: CBC cmet and lipids   Results for orders placed or performed in visit on 05/10/14  POCT CBC  Result Value Ref Range   WBC 10.0 4.6 - 10.2 K/uL   Lymph, poc 2.4 0.6 - 3.4   POC LYMPH PERCENT 23.6 10 - 50 %L   MID (cbc) 0.5 0 - 0.9   POC MID % 5.4 0 - 12 %M   POC Granulocyte 7.1 (A) 2 - 6.9   Granulocyte percent 71.0 37 - 80 %G   RBC 5.92 4.69 - 6.13 M/uL   Hemoglobin 17.1 14.1 - 18.1 g/dL   HCT, POC 52.3 43.5 - 53.7 %   MCV 88.4 80 - 97 fL   MCH, POC 28.8 27 - 31.2 pg   MCHC 32.6 31.8 - 35.4 g/dL   RDW, POC 12.9 %   Platelet Count, POC 256 142 - 424 K/uL   MPV 7.5 0 - 99.8 fL

## 2014-05-16 ENCOUNTER — Encounter: Payer: Self-pay | Admitting: Radiology

## 2014-10-24 ENCOUNTER — Telehealth: Payer: Self-pay | Admitting: Family Medicine

## 2014-10-24 NOTE — Telephone Encounter (Signed)
Pt called in to inquire about his script. Please call when ready [phone].thank you

## 2014-10-26 NOTE — Telephone Encounter (Signed)
Spoke with pt, he needs the simvastatin. He picked up from the pharmacy.

## 2014-11-14 ENCOUNTER — Ambulatory Visit (INDEPENDENT_AMBULATORY_CARE_PROVIDER_SITE_OTHER): Payer: BLUE CROSS/BLUE SHIELD

## 2014-11-14 ENCOUNTER — Ambulatory Visit (INDEPENDENT_AMBULATORY_CARE_PROVIDER_SITE_OTHER): Payer: BLUE CROSS/BLUE SHIELD | Admitting: Family Medicine

## 2014-11-14 VITALS — BP 128/82 | HR 66 | Temp 98.2°F | Resp 18 | Ht 64.0 in | Wt 179.0 lb

## 2014-11-14 DIAGNOSIS — Z125 Encounter for screening for malignant neoplasm of prostate: Secondary | ICD-10-CM

## 2014-11-14 DIAGNOSIS — Z87448 Personal history of other diseases of urinary system: Secondary | ICD-10-CM | POA: Diagnosis not present

## 2014-11-14 DIAGNOSIS — E7800 Pure hypercholesterolemia, unspecified: Secondary | ICD-10-CM

## 2014-11-14 DIAGNOSIS — L819 Disorder of pigmentation, unspecified: Secondary | ICD-10-CM | POA: Diagnosis not present

## 2014-11-14 DIAGNOSIS — M79645 Pain in left finger(s): Secondary | ICD-10-CM

## 2014-11-14 DIAGNOSIS — Z1211 Encounter for screening for malignant neoplasm of colon: Secondary | ICD-10-CM | POA: Diagnosis not present

## 2014-11-14 DIAGNOSIS — Z0000 Encounter for general adult medical examination without abnormal findings: Secondary | ICD-10-CM

## 2014-11-14 LAB — POCT URINALYSIS DIPSTICK
Bilirubin, UA: NEGATIVE
Blood, UA: NEGATIVE
Glucose, UA: NEGATIVE
Ketones, UA: NEGATIVE
Leukocytes, UA: NEGATIVE
Nitrite, UA: NEGATIVE
Protein, UA: NEGATIVE
Spec Grav, UA: 1.025
Urobilinogen, UA: 2
pH, UA: 6.5

## 2014-11-14 LAB — CBC
HCT: 42.4 % (ref 39.0–52.0)
Hemoglobin: 14.8 g/dL (ref 13.0–17.0)
MCH: 28.9 pg (ref 26.0–34.0)
MCHC: 34.9 g/dL (ref 30.0–36.0)
MCV: 82.8 fL (ref 78.0–100.0)
MPV: 9.6 fL (ref 8.6–12.4)
Platelets: 279 10*3/uL (ref 150–400)
RBC: 5.12 MIL/uL (ref 4.22–5.81)
RDW: 14.3 % (ref 11.5–15.5)
WBC: 7.7 10*3/uL (ref 4.0–10.5)

## 2014-11-14 LAB — COMPLETE METABOLIC PANEL WITH GFR
Albumin: 4.2 g/dL (ref 3.5–5.2)
BUN: 15 mg/dL (ref 6–23)
CO2: 29 mEq/L (ref 19–32)
Calcium: 9.7 mg/dL (ref 8.4–10.5)
Chloride: 104 mEq/L (ref 96–112)
Creat: 1.04 mg/dL (ref 0.50–1.35)
GFR, Est African American: 89 mL/min
GFR, Est Non African American: 77 mL/min
Glucose, Bld: 87 mg/dL (ref 70–99)
Potassium: 4.5 mEq/L (ref 3.5–5.3)
Total Bilirubin: 0.7 mg/dL (ref 0.2–1.2)
Total Protein: 7.4 g/dL (ref 6.0–8.3)

## 2014-11-14 LAB — COMPLETE METABOLIC PANEL WITHOUT GFR
ALT: 23 U/L (ref 0–53)
AST: 21 U/L (ref 0–37)
Alkaline Phosphatase: 81 U/L (ref 39–117)
Sodium: 140 meq/L (ref 135–145)

## 2014-11-14 LAB — POCT UA - MICROSCOPIC ONLY
Bacteria, U Microscopic: NEGATIVE
Casts, Ur, LPF, POC: NEGATIVE
Crystals, Ur, HPF, POC: NEGATIVE
Mucus, UA: NEGATIVE
Yeast, UA: NEGATIVE

## 2014-11-14 LAB — LIPID PANEL
Cholesterol: 170 mg/dL (ref 0–200)
HDL: 48 mg/dL (ref >=40)
LDL Cholesterol: 103 mg/dL — ABNORMAL HIGH (ref 0–99)
Total CHOL/HDL Ratio: 3.5 ratio
Triglycerides: 95 mg/dL (ref <150)
VLDL: 19 mg/dL (ref 0–40)

## 2014-11-14 LAB — TSH: TSH: 1.799 u[IU]/mL (ref 0.350–4.500)

## 2014-11-14 MED ORDER — SIMVASTATIN 20 MG PO TABS: ORAL_TABLET | ORAL | Status: DC
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
## 2014-11-15 LAB — PSA: PSA: 0.8 ng/mL (ref <=4.00)

## 2015-01-19 ENCOUNTER — Encounter: Payer: Self-pay | Admitting: Family Medicine

## 2015-02-22 ENCOUNTER — Ambulatory Visit (INDEPENDENT_AMBULATORY_CARE_PROVIDER_SITE_OTHER): Payer: Self-pay | Admitting: Family Medicine

## 2015-02-22 VITALS — BP 128/80 | HR 79 | Temp 98.5°F | Resp 18 | Ht 64.0 in | Wt 182.4 lb

## 2015-02-22 DIAGNOSIS — Z0289 Encounter for other administrative examinations: Secondary | ICD-10-CM

## 2015-02-22 DIAGNOSIS — Z021 Encounter for pre-employment examination: Secondary | ICD-10-CM

## 2015-02-22 NOTE — Progress Notes (Signed)
Chief Complaint:  Chief Complaint  Patient presents with  . dot physical    HPI: Johnny Sheppard is a 64 y.o. male who reports to [facility] today complaining of DOT PE :  He has no complaints He denies any  history of HTN/MI/OSA/DM; denies CP/SOB/palpitations He is compliant with his meds, he has no SEs He denies having SI/HI/hallucinations   Past Medical History  Diagnosis Date  . Asthma   . Arthritis   . Hyperlipidemia   . H/O hematuria     Followed by urology   Past Surgical History  Procedure Laterality Date  . Colon surgery      2012, had 1 polyp, Dr Benson [place]   Social History   Social History  . Marital Status: Married    Spouse Name: N/A  . Number of Children: N/A  . Years of Education: N/A   Social History Main Topics  . Smoking status: Former Smoker -- 1.00 packs/day for 40 years    Types: Cigarettes    Quit date: 05/20/1994  . Smokeless tobacco: None  . Alcohol Use: No  . Drug Use: No  . Sexual Activity: Yes   Other Topics Concern  . None   Social History Narrative   Family History  Problem Relation Age of Onset  . Hypertension Sister    No Known Allergies Prior to Admission medications   Medication Sig Start Date End Date Taking? Authorizing Provider  aspirin 81 MG tablet Take 81 mg by mouth daily.   Yes Historical Provider, MD  simvastatin (ZOCOR) 20 MG tablet TAKE ONE TABLET BY MOUTH IN THE EVENING. 11/14/14  Yes Eliott Amparan P Keionte Swicegood, DO  ipratropium (ATROVENT) 0.03 % nasal spray Place 2 sprays into the nose 4 (four) times daily. Patient not taking: Reported on 02/22/2015 05/10/14   Posey Boyer, MD     ROS: The patient denies fevers, chills, night sweats, unintentional weight loss, chest pain, palpitations, wheezing, dyspnea on exertion, nausea, vomiting, abdominal pain, dysuria, hematuria, melena, numbness, weakness, or tingling.   All other systems have been reviewed and were otherwise negative with the exception of those mentioned in the HPI and  as above.    PHYSICAL EXAM: Filed Vitals:   02/22/15 2003  BP: 128/80  Pulse: 79  Temp: 98.5 F (36.9 C)  Resp: 18   Body mass index is 31.29 kg/(m^2).   General: Alert, no acute distress HEENT:  Normocephalic, atraumatic, oropharynx patent. EOMI, PERRLA Cardiovascular:  Regular rate and rhythm, no rubs murmurs or gallops.  No Carotid bruits, radial pulse intact. No pedal edema.  Respiratory: Clear to auscultation bilaterally.  No wheezes, rales, or rhonchi.  No cyanosis, no use of accessory musculature Abdominal: No organomegaly, abdomen is soft and non-tender, positive bowel sounds. No masses. Skin: No rashes. Neurologic: Facial musculature symmetric. Psychiatric: Patient acts appropriately throughout our interaction. Lymphatic: No cervical or submandibular lymphadenopathy Musculoskeletal: Gait intact. No edema, tenderness Neg hernia 5/5 strength, 2/2 DTRs   LABS: Results for orders placed or performed in visit on 11/14/14  COMPLETE METABOLIC PANEL WITH GFR  Result Value Ref Range   Sodium 140 135 - 145 mEq/L   Potassium 4.5 3.5 - 5.3 mEq/L   Chloride 104 96 - 112 mEq/L   CO2 29 19 - 32 mEq/L   Glucose, Bld 87 70 - 99 mg/dL   BUN 15 6 - 23 mg/dL   Creat 1.04 0.50 - 1.35 mg/dL   Total Bilirubin 0.7 0.2 - 1.2  mg/dL   Alkaline Phosphatase 81 39 - 117 U/L   AST 21 0 - 37 U/L   ALT 23 0 - 53 U/L   Total Protein 7.4 6.0 - 8.3 g/dL   Albumin 4.2 3.5 - 5.2 g/dL   Calcium 9.7 8.4 - 10.5 mg/dL   GFR, Est African American 89 mL/min   GFR, Est Non African American 77 mL/min  CBC  Result Value Ref Range   WBC 7.7 4.0 - 10.5 K/uL   RBC 5.12 4.22 - 5.81 MIL/uL   Hemoglobin 14.8 13.0 - 17.0 g/dL   HCT 42.4 39.0 - 52.0 %   MCV 82.8 78.0 - 100.0 fL   MCH 28.9 26.0 - 34.0 pg   MCHC 34.9 30.0 - 36.0 g/dL   RDW 14.3 11.5 - 15.5 %   Platelets 279 150 - 400 K/uL   MPV 9.6 8.6 - 12.4 fL  Lipid panel  Result Value Ref Range   Cholesterol 170 0 - 200 mg/dL   Triglycerides 95  <150 mg/dL   HDL 48 >=40 mg/dL   Total CHOL/HDL Ratio 3.5 Ratio   VLDL 19 0 - 40 mg/dL   LDL Cholesterol 103 (H) 0 - 99 mg/dL  TSH  Result Value Ref Range   TSH 1.799 0.350 - 4.500 uIU/mL  PSA  Result Value Ref Range   PSA 0.80 <=4.00 ng/mL  POCT UA - Microscopic Only  Result Value Ref Range   WBC, Ur, HPF, POC 0-1    RBC, urine, microscopic 0-5    Bacteria, U Microscopic neg    Mucus, UA neg    Epithelial cells, urine per micros 0-1    Crystals, Ur, HPF, POC neg    Casts, Ur, LPF, POC neg    Yeast, UA neg   POCT urinalysis dipstick  Result Value Ref Range   Color, UA yellow    Clarity, UA clear    Glucose, UA neg    Bilirubin, UA neg    Ketones, UA neg    Spec Grav, UA 1.025    Blood, UA neg    pH, UA 6.5    Protein, UA neg    Urobilinogen, UA 2.0    Nitrite, UA neg    Leukocytes, UA Negative Negative     EKG/XRAY:   Primary read interpreted by Dr. Marin Comment at [facility].   ASSESSMENT/PLAN: Encounter Diagnosis  Name Primary?  . 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 Patient is aware that all medications have potential sideeffects and we are unable to predict every sideeffect or drug-drug interaction that may occur.  Finesse Fielder DO  02/22/2015 9:00 PM

## 2015-08-13 ENCOUNTER — Ambulatory Visit (INDEPENDENT_AMBULATORY_CARE_PROVIDER_SITE_OTHER): Payer: BLUE CROSS/BLUE SHIELD

## 2015-08-13 ENCOUNTER — Ambulatory Visit (INDEPENDENT_AMBULATORY_CARE_PROVIDER_SITE_OTHER): Payer: BLUE CROSS/BLUE SHIELD | Admitting: Family Medicine

## 2015-08-13 VITALS — BP 128/88 | HR 62 | Temp 97.8°F | Resp 16 | Ht 64.0 in | Wt 178.0 lb

## 2015-08-13 DIAGNOSIS — H6122 Impacted cerumen, left ear: Secondary | ICD-10-CM | POA: Diagnosis not present

## 2015-08-13 DIAGNOSIS — S8391XA Sprain of unspecified site of right knee, initial encounter: Secondary | ICD-10-CM

## 2015-08-13 DIAGNOSIS — S76111A Strain of right quadriceps muscle, fascia and tendon, initial encounter: Secondary | ICD-10-CM

## 2015-08-13 DIAGNOSIS — H918X2 Other specified hearing loss, left ear: Secondary | ICD-10-CM | POA: Diagnosis not present

## 2015-08-13 MED ORDER — MELOXICAM 15 MG PO TABS: 15.0000 mg | ORAL_TABLET | Freq: Every day | ORAL | Status: DC

## 2015-08-13 MED ORDER — HYDROCORTISONE-ACETIC ACID 1-2 % OT SOLN: 3.0000 [drp] | Freq: Two times a day (BID) | OTIC | Status: DC | PRN

## 2015-08-13 NOTE — Progress Notes (Signed)
Subjective:    Patient ID: Johnny and Johnny Sheppard, male    DOB: 04/06/1951, 65 y.o.   MRN: [id number] By signing my name below, I, Johnny Sheppard, attest that this documentation has been prepared under the direction and in the presence of Delman Cheadle, MD. Electronically Signed: Judithe Sheppard, ER Scribe. 08/13/2015. 10:34 AM.  Chief Complaint  Patient presents with  . Knee Pain    rt., x 2-3 days  . Cerumen Impaction  . Immunizations    tdap     HPI HPI Comments: Johnny Sheppard is a 65 y.o. male who presents to [facility] complaining of anterior, superior knee pain for the last two days. He had some knee pain two months ago after kneeling on concrete. He did not see anyone for his knee sx at that time, his sx gradually resolved. He used a soft knee brace and liniment oil with a heating pad at that time. He took tylenol last night for his knee pain. He is having difficulty lifting his right knee.    Past Medical History  Diagnosis Date  . Asthma   . Arthritis   . Hyperlipidemia   . H/O hematuria     Followed by urology   No Known Allergies  Current Outpatient Prescriptions on File Prior to Visit  Medication Sig Dispense Refill  . aspirin 81 MG tablet Take 81 mg by mouth daily.    . simvastatin (ZOCOR) 20 MG tablet TAKE ONE TABLET BY MOUTH IN THE EVENING. 90 tablet 3   No current facility-administered medications on file prior to visit.    Review of Systems  Constitutional: Positive for activity change. Negative for fever, chills and appetite change.  HENT: Positive for ear discharge (freq ear itching, uses pencap to itch them). Negative for congestion, ear pain, hearing loss, sinus pressure, tinnitus and trouble swallowing.        Wax in ears.  Musculoskeletal: Positive for arthralgias. Negative for myalgias, joint swelling and gait problem.  Skin: Negative for color change and rash.  Neurological: Positive for weakness. Negative for numbness.      Objective:  BP 128/88 mmHg   Pulse 62  Temp(Src) 97.8 F (36.6 C) (Oral)  Resp 16  Ht 5\' 4"  (1.626 m)  Wt 178 lb (80.74 kg)  BMI 30.54 kg/m2  SpO2 98%  Physical Exam  Constitutional: He is oriented to person, place, and time. He appears well-developed and well-nourished. No distress.  HENT:  Head: Normocephalic and atraumatic.  TMs normal. Moderate amount of cerumen on left.   Eyes: Pupils are equal, round, and reactive to light.  Neck: Neck supple.  Cardiovascular: Normal rate.   Pulmonary/Chest: Effort normal. No respiratory distress.  Musculoskeletal: Normal range of motion.  No medial or lateral joint line tenderness. No tenderness over the MCL or LCL. Patella is stable. No pain with varis or valgus stress. Negative anterior and posterior drawer. Positive murrays on both lateral and medial aspect. Quad weakness on the right. Quad weakness on right. Strength 5/5 in hamstring, 4+/5 in quadricepts, 4/5 in hip flexor.   Neurological: He is alert and oriented to person, place, and time. Coordination normal.  Skin: Skin is warm and dry. He is not diaphoretic.  Psychiatric: He has a normal mood and affect. His behavior is normal.  Nursing note and vitals reviewed.  After lavage of both ears he had slight decrease in hearing of soft sounds, but not significant.   Dg Knee 1-2 Views Right  08/13/2015  CLINICAL DATA:  Worsening right knee pain last 2 months EXAM: RIGHT KNEE - 1-2 VIEW COMPARISON:  None. FINDINGS: Two views of the right knee submitted. No acute fracture or subluxation. There is diffuse narrowing of joint space. Narrowing of patellofemoral joint space. Spurring of patella. Small joint effusion. IMPRESSION: No acute fracture or subluxation. Degenerative changes as described above. Small joint effusion. Electronically Signed   By: Lahoma Crocker M.D.   On: 08/13/2015 11:06       Assessment & Plan:   1. Hearing loss secondary to cerumen impaction, left   2. Strain of quadriceps tendon, right, initial encounter  - ice and start strengthening exercises daily at home - handout given. Placed in web knee brace to use prn which did help his sxs even in the office    Orders Placed This Encounter  Procedures  . DG Knee 1-2 Views Right    Standing Status: Future     Number of Occurrences: 1     Standing Expiration Date: 08/12/2016    Order Specific Question:  Reason for Exam (SYMPTOM  OR DIAGNOSIS REQUIRED)    Answer:  worsening knee pain after kneeling 2 mos ago    Order Specific Question:  Preferred imaging location?    Answer:  External  . Ear wax removal    Meds ordered this encounter  Medications  . meloxicam (MOBIC) 15 MG tablet    Sig: Take 1 tablet (15 mg total) by mouth daily.    Dispense:  30 tablet    Refill:  0  . acetic acid-hydrocortisone (VOSOL-HC) otic solution    Sig: Place 3 drops into both ears 2 (two) times daily as needed.    Dispense:  10 mL    Refill:  5    I personally performed the services described in this documentation, which was scribed in my presence. The recorded information has been reviewed and considered, and addended by me as needed.  Delman Cheadle, MD MPH

## 2015-08-13 NOTE — Patient Instructions (Addendum)
IF you received an x-ray today, you will receive an invoice from [facility]. Please contact [facility] at [phone] with questions or concerns regarding your invoice.   IF you received labwork today, you will receive an invoice from [employer]. Please contact [facility] at [phone] with questions or concerns regarding your invoice.   Our billing staff will not be able to assist you with questions regarding bills from these companies.  You will be contacted with the lab results as soon as they are available. The fastest way to get your results is to activate your My Chart account. Instructions are located on the last page of this paperwork. If you have not heard from [place] regarding the results in 2 weeks, please contact this office.    Quadriceps Tendon Tear or Disruption With Rehab The quadriceps muscles are located on the front of the thigh and are responsible for straightening the knee and bending the hip. The quadriceps tendon connects these muscles to the kneecap (patella) and also from the patella to a portion of the shin bone (tibial tubercle). A quadriceps tendon tear or disruption is characterized by a partial or complete tear of the quadriceps tendon between the quadriceps muscles and the patella. Quadriceps tendon tears or disruptions often cause pain above the knee and result in a decrease in function of the quadriceps muscles.  SYMPTOMS   A "pop" or tear felt or heard above the patella at the time of injury.  Pain, tenderness, inflammation, and/or bruising over the quadriceps tendon.  Pain that worsens with use of the quadriceps muscles.  Difficulty with common tasks that involve the quadriceps muscle, such as walking.  A crackling sound (crepitation) when the tendon is moved or touched.  Loss of fullness of the muscle or bulging within the area of muscle with complete rupture. CAUSES  A strain occurs when a force is  placed on the muscle or tendon that is greater than it can withstand. Common mechanisms of injury include:  Repetitive strenuous use of the quadriceps muscles. This may be due to an increase in the intensity, frequency, or duration of exercise.  Direct trauma to the quadriceps muscles or tendons. RISK INCREASES WITH:  Activities that involve forceful contractions of the quadriceps muscles (jumping or sprinting).  Contact sports (soccer or football).  Poor strength and flexibility.  Failure to warm-up properly before activity.  Previous injury to the thigh or knee.  Untreated quadriceps tendinitis.  Corticosteroid injections into the quadriceps tendon. PREVENTION  Warm up and stretch properly before activity.  Allow for adequate recovery between workouts.  Maintain physical fitness:  Strength, flexibility, and endurance.  Cardiovascular fitness.  Wear properly fitted and padded protective equipment. PROGNOSIS  If treated properly, then recovery from a quadriceps tendon tear or disruption usually occurs; however the recovery period may b 6 to 9 months.  RELATED COMPLICATIONS   Quadriceps muscle weakness.  Re-rupture of the tendon after treatment.  Prolonged healing time, if improperly treated or re-injured.  Risks of surgery: infection, bleeding, nerve damage, or damage to surrounding tissues. TREATMENT  Initial treatment involves rest from any activities that aggravate the symptoms. Ice, medication, and elevation may be used to help reduce pain and inflammation. The use of strengthening and stretching exercises may help reduce pain with activity. These exercises may be performed at home or with referral to a therapist. If the tear is complete, then surgery is usually required to repair the tendon, as it cannot heal  on its own. After surgery immobilization is required to allow for healing. After immobilization it is important to perform strengthening and stretching  exercises to help regain strength and a full range of motion.  MEDICATION   If pain medication is necessary, then nonsteroidal anti-inflammatory medications, such as aspirin and ibuprofen, or other minor pain relievers, such as acetaminophen, are often recommended.  Do not take pain medication for 7 days before surgery.  Prescription pain relievers may be given if deemed necessary by your caregiver. Use only as directed and only as much as you need. COLD THERAPY  Cold treatment (icing) relieves pain and reduces inflammation. Cold treatment should be applied for 10 to 15 minutes every 2 to 3 hours for inflammation and pain and immediately after any activity that aggravates your symptoms. Use ice packs or massage the area with a piece of ice (ice massage). SEEK MEDICAL CARE IF:  Treatment seems to offer no benefit, or the condition worsens.  Any medications produce adverse side effects.  Any complications from surgery occur:  Pain, numbness, or coldness in the extremity operated upon.  Discoloration of the nail beds (they become blue or gray) of the extremity operated upon.  Signs of infections (fever, pain, inflammation, redness, or persistent bleeding). EXERCISES RANGE OF MOTION (ROM) AND STRETCHING EXERCISES - Quadriceps Tendon Tear/Disruption These exercises may help you when beginning to rehabilitate your injury. Your symptoms may resolve with or without further involvement from your physician, physical therapist or athletic trainer. While completing these exercises, remember:   Restoring tissue flexibility helps normal motion to return to the joints. This allows healthier, less painful movement and activity.  An effective stretch should be held for at least 30 seconds.  A stretch should never be painful. You should only feel a gentle lengthening or release in the stretched tissue. RANGE OF MOTION - Knee Flexion and Extension, Active-Assisted  Sit on the edge of a table or chair  with your thighs firmly supported. It may be helpful to place a folded towel under the end of your right / left thigh.  Flexion (bending): Place the ankle of your healthy leg on top of the other ankle. Use your healthy leg to gently bend your right / left knee until you feel a mild tension across the top of your knee.  Hold for __________ seconds.  Extension (straightening): Switch your ankles so your right / left leg is on top. Use your healthy leg to straighten your right / left knee until you feel a mild tension on the backside of your knee.  Hold for __________ seconds. Repeat __________ times. Complete __________ times per day. RANGE OF MOTION - Knee Flexion, Active  Lie on your back with both knees straight. (If this causes back discomfort, bend your opposite knee, placing your foot flat on the floor.)  Slowly slide your heel back toward your buttocks until you feel a gentle stretch in the front of your knee or thigh.  Hold for __________ seconds. Slowly slide your heel back to the starting position. Repeat __________ times. Complete this exercise __________ times per day.  STRETCH - Knee Flexion, Supine  Lie on the floor with your right / left heel/foot lightly touching the wall (place both feet on the wall if you do not use a door frame).  Without using any effort, allow gravity to slide your foot down the wall slowly until you feel a gentle stretch in the front of your right / left knee.  Hold this  stretch for __________ seconds. Then return the leg to the starting position, using your health leg for help, if needed. Repeat __________ times. Complete this stretch __________ times per day.  STRETCH - Quadriceps, Prone   Lie on your stomach on a firm surface, such as a bed or padded floor.  Bend your right / left knee and grasp your ankle. If you are unable to reach, your ankle or pant leg, use a belt around your foot to lengthen your reach.  Gently pull your heel toward your  buttocks. Your knee should not slide out to the side. You should feel a stretch in the front of your thigh and/or knee. Hold this position for __________ seconds.  Repeat __________ times. Complete this stretch __________ times per day.  STRENGTHENING EXERCISES - Quadriceps Tendon Tear/Disruption These exercises may help you when beginning to rehabilitate your injury. They may resolve your symptoms with or without further involvement from your physician, physical therapist or athletic trainer. While completing these exercises, remember:   Muscles can gain both the endurance and the strength needed for everyday activities through controlled exercises.  Complete these exercises as instructed by your physician, physical therapist or athletic trainer. Progress the resistance and repetitions only as guided. STRENGTH - Quadriceps, Isometrics  Lie on your back with your right / left leg extended and your opposite knee bent.  Gradually tense the muscles in the front of your right / left thigh. You should see either your knee cap slide up toward your hip or increased dimpling just above the knee. This motion will push the back of the knee down toward the floor/mat/bed on which you are lying.  Hold the muscle as tight as you can without increasing your pain for __________ seconds.  Relax the muscles slowly and completely in between each repetition. Repeat __________ times. Complete this exercise __________ times per day.  STRENGTH - Quadriceps, Short Arcs   Lie on your back. Place a __________ inch towel roll under your knee so that the knee slightly bends.  Raise only your lower leg by tightening the muscles in the front of your thigh. Do not allow your thigh to rise.  Hold this position for __________ seconds. Repeat __________ times. Complete this exercise __________ times per day.  OPTIONAL ANKLE WEIGHTS: Begin with ____________________, but DO NOT exceed ____________________. Increase in1  lb/0.5 kg increments.  STRENGTH - Quadriceps, Straight Leg Raises  Quality counts! Watch for signs that the quadriceps muscle is working to insure you are strengthening the correct muscles and not "cheating" by substituting with healthier muscles.  Lay on your back with your right / left leg extended and your opposite knee bent.  Tense the muscles in the front of your right / left thigh. You should see either your knee cap slide up or increased dimpling just above the knee. Your thigh may even quiver.  Tighten these muscles even more and raise your leg 4 to 6 inches off the floor. Hold for __________ seconds.  Keeping these muscles tense, lower your leg.  Relax the muscles slowly and completely in between each repetition. Repeat __________ times. Complete this exercise __________ times per day.  STRENGTH - Quadriceps, Step-Ups   Use a thick book, step or step stool that is __________ inches tall.  Holding a wall or counter for balance only, not support.  Slowly step-up with your right / left foot, keeping your knee in line with your hip and foot. Do not allow your knee to bend  so far that you cannot see your toes.  Slowly unlock your knee and lower yourself to the starting position. Your muscles, not gravity, should lower you. Repeat __________ times. Complete this exercise __________ times per day.  STRENGTH - Quadriceps, Wall Slides  Follow guidelines for form closely. Increased knee pain often results from poorly placed feet or knees.  Lean against a smooth wall or door and walk your feet out 18-24 inches. Place your feet hip-width apart.  Slowly slide down the wall or door until your knees bend __________ degrees.* Keep your knees over your heels, not your toes, and in line with your hips, not falling to either side.  Hold for __________ seconds. Stand up to rest for __________ seconds in between each repetition. Repeat __________ times. Complete this exercise __________ times per  day. * Your physician, physical therapist or athletic trainer will alter this angle based on your symptoms and progress.   This information is not intended to replace advice given to you by your health care provider. Make sure you discuss any questions you have with your health care provider.   Document Released: 05/06/2005 Document Revised: 09/20/2014 Document Reviewed: 08/18/2008 Elsevier Interactive Patient Education [employer].

## 2016-01-22 ENCOUNTER — Encounter (HOSPITAL_COMMUNITY): Payer: Self-pay | Admitting: Emergency Medicine

## 2016-01-22 ENCOUNTER — Ambulatory Visit (HOSPITAL_COMMUNITY)
Admission: EM | Admit: 2016-01-22 | Discharge: 2016-01-22 | Disposition: A | Discharge status: Home / Self Care | Payer: 59 | Attending: Radiology | Admitting: Radiology

## 2016-01-22 DIAGNOSIS — E785 Hyperlipidemia, unspecified: Secondary | ICD-10-CM

## 2016-01-22 DIAGNOSIS — Z760 Encounter for issue of repeat prescription: Secondary | ICD-10-CM

## 2016-01-22 MED ORDER — SIMVASTATIN 20 MG PO TABS: ORAL_TABLET | ORAL | 0 refills | Status: DC

## 2016-01-22 NOTE — ED Triage Notes (Signed)
The patient presented to the [facility] to request a refill on his simvastatin.

## 2016-01-22 NOTE — ED Provider Notes (Signed)
CSN: [id number]     Arrival date & time 01/22/16  1302 History   None    Chief Complaint  Patient presents with  . Medication Refill   (Consider location/radiation/quality/duration/timing/severity/associated sxs/prior Treatment) 65 y.o. male presents for medication refill of simvastatin. Patient states that he has 2 more pills. Patient states that he is seen by a PCP on [address] and will follow up with them for medication and blood work. Patient denies any chest pain or recent illness.       Past Medical History:  Diagnosis Date  . Arthritis   . Asthma   . H/O hematuria    Followed by urology  . Hyperlipidemia    Past Surgical History:  Procedure Laterality Date  . COLON SURGERY     2012, had 1 polyp, Dr Benson [place]   Family History  Problem Relation Age of Onset  . Hypertension Sister    Social History  Substance Use Topics  . Smoking status: Former Smoker    Packs/day: 1.00    Years: 40.00    Types: Cigarettes    Quit date: 05/20/1994  . Smokeless tobacco: Never Used  . Alcohol use No    Review of Systems  Constitutional: Negative.   Respiratory: Negative.   Cardiovascular: Negative.     Allergies  Review of patient's allergies indicates no known allergies.  Home Medications   Prior to Admission medications   Medication Sig Start Date End Date Taking? Authorizing Provider  acetic acid-hydrocortisone (VOSOL-HC) otic solution Place 3 drops into both ears 2 (two) times daily as needed. 08/13/15   Shawnee Knapp, MD  aspirin 81 MG tablet Take 81 mg by mouth daily.    Historical Provider, MD  meloxicam (MOBIC) 15 MG tablet Take 1 tablet (15 mg total) by mouth daily. 08/13/15   Shawnee Knapp, MD  simvastatin (ZOCOR) 20 MG tablet TAKE ONE TABLET BY MOUTH IN THE EVENING. 01/22/16   Jacqualine Mau, NP   Meds Ordered and Administered this Visit  Medications - No data to display  BP 136/68 (BP Location: Left Arm)   Pulse 63   Temp 98.7 F (37.1 C) (Oral)   Resp 16    SpO2 98%  No data found.   Physical Exam  Constitutional: He is oriented to person, place, and time. He appears well-developed and well-nourished.  Cardiovascular: Normal rate and regular rhythm.   Pulmonary/Chest: Effort normal and breath sounds normal.  Neurological: He is oriented to person, place, and time.  Skin: Skin is dry.    Urgent Care Course   Clinical Course    Procedures (including critical care time)  Labs Review Labs Reviewed - No data to display  Imaging Review No results found.   Visual Acuity Review  Right Eye Distance:   Left Eye Distance:   Bilateral Distance:    Right Eye Near:   Left Eye Near:    Bilateral Near:         MDM   1. Medication refill       Jacqualine Mau, NP 01/22/16 [place], NP 01/22/16 [phone]

## 2016-01-22 NOTE — Discharge Instructions (Signed)
Please follow up with your provider for additional refills and labwork.

## 2016-01-30 ENCOUNTER — Ambulatory Visit (INDEPENDENT_AMBULATORY_CARE_PROVIDER_SITE_OTHER): Payer: 59 | Admitting: Family Medicine

## 2016-01-30 VITALS — BP 142/88 | HR 70 | Temp 98.5°F | Resp 16 | Ht 64.0 in | Wt 182.0 lb

## 2016-01-30 DIAGNOSIS — Z23 Encounter for immunization: Secondary | ICD-10-CM | POA: Diagnosis not present

## 2016-01-30 DIAGNOSIS — E785 Hyperlipidemia, unspecified: Secondary | ICD-10-CM

## 2016-01-30 DIAGNOSIS — R030 Elevated blood-pressure reading, without diagnosis of hypertension: Secondary | ICD-10-CM | POA: Diagnosis not present

## 2016-01-30 DIAGNOSIS — IMO0001 Reserved for inherently not codable concepts without codable children: Secondary | ICD-10-CM

## 2016-01-30 DIAGNOSIS — R42 Dizziness and giddiness: Secondary | ICD-10-CM

## 2016-01-30 LAB — CBC WITH DIFFERENTIAL/PLATELET
BASOS ABS: 65 {cells}/uL (ref 0–200)
Basophils Relative: 1 %
Eosinophils Absolute: 195 cells/uL (ref 15–500)
Eosinophils Relative: 3 %
HEMATOCRIT: 43.3 % (ref 38.5–50.0)
HEMOGLOBIN: 15.3 g/dL (ref 13.2–17.1)
LYMPHS ABS: 2145 {cells}/uL (ref 850–3900)
Lymphocytes Relative: 33 %
MCH: 29.1 pg (ref 27.0–33.0)
MCHC: 35.3 g/dL (ref 32.0–36.0)
MCV: 82.5 fL (ref 80.0–100.0)
MONO ABS: 585 {cells}/uL (ref 200–950)
MPV: 9.4 fL (ref 7.5–12.5)
Monocytes Relative: 9 %
NEUTROS PCT: 54 %
Neutro Abs: 3510 cells/uL (ref 1500–7800)
Platelets: 277 10*3/uL (ref 140–400)
RBC: 5.25 MIL/uL (ref 4.20–5.80)
RDW: 13 % (ref 11.0–15.0)
WBC: 6.5 10*3/uL (ref 3.8–10.8)

## 2016-01-30 LAB — LIPID PANEL
Cholesterol: 155 mg/dL (ref 125–200)
HDL: 46 mg/dL (ref >=40)
LDL CALC: 93 mg/dL (ref <130)
TRIGLYCERIDES: 80 mg/dL (ref <150)
Total CHOL/HDL Ratio: 3.4 Ratio (ref <=5.0)
VLDL: 16 mg/dL (ref <30)

## 2016-01-30 LAB — COMPLETE METABOLIC PANEL WITH GFR
ALT: 19 U/L (ref 9–46)
AST: 18 U/L (ref 10–35)
Albumin: 4 g/dL (ref 3.6–5.1)
Alkaline Phosphatase: 67 U/L (ref 40–115)
BUN: 12 mg/dL (ref 7–25)
CHLORIDE: 105 mmol/L (ref 98–110)
CO2: 25 mmol/L (ref 20–31)
Calcium: 9 mg/dL (ref 8.6–10.3)
Creat: 0.9 mg/dL (ref 0.70–1.25)
GFR, Est African American: >89 mL/min (ref >=60)
GFR, Est Non African American: >89 mL/min (ref >=60)
GLUCOSE: 109 mg/dL — AB (ref 65–99)
POTASSIUM: 4.4 mmol/L (ref 3.5–5.3)
SODIUM: 138 mmol/L (ref 135–146)
Total Bilirubin: 0.7 mg/dL (ref 0.2–1.2)
Total Protein: 7.1 g/dL (ref 6.1–8.1)

## 2016-01-30 LAB — TSH: TSH: 0.52 mIU/L (ref 0.40–4.50)

## 2016-01-30 MED ORDER — AMLODIPINE BESYLATE 2.5 MG PO TABS: 2.5000 mg | ORAL_TABLET | Freq: Every day | ORAL | 3 refills | Status: DC

## 2016-01-30 MED ORDER — SIMVASTATIN 20 MG PO TABS: ORAL_TABLET | ORAL | 6 refills | Status: DC

## 2016-01-30 NOTE — Progress Notes (Signed)
Patient ID: Johnny Sheppard, male    DOB: 08/29/1950, 65 y.o.   MRN: [id number]  PCP: Leotis Pain, DO  Chief Complaint  Patient presents with  . Medication Refill    lipid check and simvastatin refill    Subjective:   HPI Presents for evaluation of wellness check.  He history of hyperlipidemia. Reports blood pressure in 130's. No shortness of breath, chest pain.  Occasional dizziness but no headaches. Feels his blood pressure is elevated due to recent high intake of fast food. 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 He doesn't want to add any additional medications. He reports taking simvastatin regularly without any adverse side effects. Reports no routine exercise.  Social History   Social History  . Marital status: Married    Spouse name: N/A  . Number of children: N/A  . Years of education: N/A   Occupational History  . Not on file.   Social History Main Topics  . Smoking status: Former Smoker    Packs/day: 1.00    Years: 40.00    Types: Cigarettes    Quit date: 05/20/1994  . Smokeless tobacco: Never Used  . Alcohol use No  . Drug use: No  . Sexual activity: Yes   Other Topics Concern  . Not on file   Social History Narrative  . No narrative on file    Family History  Problem Relation Age of Onset  . Hypertension Sister     Review of Systems See HPI    Patient Active Problem List   Diagnosis Date Noted  . Hyperlipidemia 05/31/2012     Prior to Admission medications   Medication Sig Start Date End Date Taking? Authorizing Provider  acetic acid-hydrocortisone (VOSOL-HC) otic solution Place 3 drops into both ears 2 (two) times daily as needed. 08/13/15  Yes Shawnee Knapp, MD  aspirin 81 MG tablet Take 81 mg by mouth daily.   Yes Historical Provider, MD  simvastatin (ZOCOR) 20 MG tablet TAKE ONE TABLET BY MOUTH IN THE EVENING. 01/22/16  Yes Jacqualine Mau, NP   No Known Allergies     Objective:  Physical Exam    Constitutional: He is oriented to person, place, and time. He appears well-developed and well-nourished.  HENT:  Head: Normocephalic and atraumatic.  Right Ear: External ear normal.  Left Ear: External ear normal.  Nose: Nose normal.  Mouth/Throat: Oropharynx is clear and moist.  Eyes: Conjunctivae and EOM are normal. Pupils are equal, round, and reactive to light.  Neck: Normal range of motion. Neck supple.  Cardiovascular: Normal rate, regular rhythm, normal heart sounds and intact distal pulses.   Pulmonary/Chest: Effort normal and breath sounds normal.  Musculoskeletal: Normal range of motion.  Neurological: He is alert and oriented to person, place, and time. He has normal reflexes.  Skin: Skin is warm and dry.  Psychiatric: He has a normal mood and affect. His behavior is normal. Judgment and thought content normal.    . Vitals:   01/30/16 0817  BP: (!) 146/98  Pulse: 70  Resp: 16  Temp: 98.5 F (36.9 C)   Assessment & Plan:  1. Elevated blood pressure, stable, elevated time two readings during this visit. Patient will not be able to return to office for follow-up within 2 weeks.  Plan: - COMPLETE METABOLIC PANEL WITH GFR -Patient will begin to check blood pressure regularly and will consider starting  Amlodipine 2.5 mg once daily.  2. Hyperlipidemia, controlled. Plan: - Lipid panel-stable -increase physical activity to improve weight loss  3. Episode of dizziness, possible idiopathic or related to shifts in blood pressure.  Plan: -Monitor blood pressures -Change positions slowly and rise from sitting to standing position slowly. - CBC with Differential/Platelet-unremarkable - TSH-unremarkable  Follow-up in 4 weeks for blood recheck.  Carroll Sage. Kenton Kingfisher, MSN, FNP-C [facility]

## 2016-01-30 NOTE — Patient Instructions (Addendum)
Return for follow-up in 4 weeks for blood pressure recheck.  I am giving you a prescription for amlodipine 2.5 for hypertension management.  If pressures are consistently greater than [id number] systolic (top number) and greater than 80 diastolic (bottom number), please fill medication and start taking as directed.  Will contact you regarding your lab results.  Refilled simvastatin.  IF you received an x-ray today, you will receive an invoice from [facility]. Please contact [facility] at [phone] with questions or concerns regarding your invoice.   IF you received labwork today, you will receive an invoice from [employer]. Please contact [facility] at [phone] with questions or concerns regarding your invoice.   Our billing staff will not be able to assist you with questions regarding bills from these companies.  You will be contacted with the lab results as soon as they are available. The fastest way to get your results is to activate your My Chart account. Instructions are located on the last page of this paperwork. If you have not heard from [place] regarding the results in 2 weeks, please contact this office.     Exercising to Stay Healthy Exercising regularly is important. It has many health benefits, such as:  Improving your overall fitness, flexibility, and endurance.  Increasing your bone density.  Helping with weight control.  Decreasing your body fat.  Increasing your muscle strength.  Reducing stress and tension.  Improving your overall health. In order to become healthy and stay healthy, it is recommended that you do moderate-intensity and vigorous-intensity exercise. You can tell that you are exercising at a moderate intensity if you have a higher heart rate and faster breathing, but you are still able to hold a conversation. You can tell that you are exercising at a vigorous intensity if you are breathing much harder and faster  and cannot hold a conversation while exercising. HOW OFTEN SHOULD I EXERCISE? Choose an activity that you enjoy and set realistic goals. Your health care provider can help you to make an activity plan that works for you. Exercise regularly as directed by your health care provider. This may include:   Doing resistance training twice each week, such as:  Push-ups.  Sit-ups.  Lifting weights.  Using resistance bands.  Doing a given intensity of exercise for a given amount of time. Choose from these options:  150 minutes of moderate-intensity exercise every week.  75 minutes of vigorous-intensity exercise every week.  A mix of moderate-intensity and vigorous-intensity exercise every week. Children, pregnant women, people who are out of shape, people who are overweight, and older adults may need to consult a health care provider for individual recommendations. If you have any sort of medical condition, be sure to consult your health care provider before starting a new exercise program.  WHAT ARE SOME EXERCISE IDEAS? Some moderate-intensity exercise ideas include:   Walking at a rate of 1 mile in 15 minutes.  Biking.  Hiking.  Golfing.  Dancing. Some vigorous-intensity exercise ideas include:   Walking at a rate of at least 4.5 miles per hour.  Jogging or running at a rate of 5 miles per hour.  Biking at a rate of at least 10 miles per hour.  Lap swimming.  Roller-skating or in-line skating.  Cross-country skiing.  Vigorous competitive sports, such as football, basketball, and soccer.  Jumping rope.  Aerobic dancing. WHAT ARE SOME EVERYDAY ACTIVITIES THAT CAN HELP ME TO GET EXERCISE?  Yard work, such as:  Pushing a  Conservation officer, nature.  Raking and bagging leaves.  Washing and waxing your car.  Pushing a stroller.  Shoveling snow.  Gardening.  Washing windows or floors. HOW CAN I BE MORE ACTIVE IN MY DAY-TO-DAY ACTIVITIES?  Use the stairs instead of the  elevator.  Take a walk during your lunch break.  If you drive, park your car farther away from work or school.  If you take public transportation, get off one stop early and walk the rest of the way.  Make all of your phone calls while standing up and walking around.  Get up, stretch, and walk around every 30 minutes throughout the day. WHAT GUIDELINES SHOULD I FOLLOW WHILE EXERCISING?  Do not exercise so much that you hurt yourself, feel dizzy, or get very short of breath.  Consult your health care provider before starting a new exercise program.  Wear comfortable clothes and shoes with good support.  Drink plenty of water while you exercise to prevent dehydration or heat stroke. Body water is lost during exercise and must be replaced.  Work out until you breathe faster and your heart beats faster.   This information is not intended to replace advice given to you by your health care provider. Make sure you discuss any questions you have with your health care provider.   Document Released: 06/08/2010 Document Revised: 05/27/2014 Document Reviewed: 10/07/2013 Elsevier Interactive Patient Education 2016 [employer].  Hypertension Hypertension is another name for high blood pressure. High blood pressure forces your heart to work harder to pump blood. A blood pressure reading has two numbers, which includes a higher number over a lower number (example: 110/72). HOME CARE   Have your blood pressure rechecked by your doctor.  Only take medicine as told by your doctor. Follow the directions carefully. The medicine does not work as well if you skip doses. Skipping doses also puts you at risk for problems.  Do not smoke.  Monitor your blood pressure at home as told by your doctor. GET HELP IF:  You think you are having a reaction to the medicine you are taking.  You have repeat headaches or feel dizzy.  You have puffiness (swelling) in your ankles.  You have trouble with your  vision. GET HELP RIGHT AWAY IF:   You get a very bad headache and are confused.  You feel weak, numb, or faint.  You get chest or belly (abdominal) pain.  You throw up (vomit).  You cannot breathe very well. MAKE SURE YOU:   Understand these instructions.  Will watch your condition.  Will get help right away if you are not doing well or get worse.   This information is not intended to replace advice given to you by your health care provider. Make sure you discuss any questions you have with your health care provider.   Document Released: 10/23/2007 Document Revised: 05/11/2013 Document Reviewed: 02/26/2013 Elsevier Interactive Patient Education [employer].

## 2016-01-31 ENCOUNTER — Encounter: Payer: Self-pay | Admitting: Family Medicine

## 2016-01-31 NOTE — Progress Notes (Signed)
Dear Johnny Sheppard,  Below are the results from your recent visit were as expected.  Resulted Orders  COMPLETE METABOLIC PANEL WITH GFR  Result Value Ref Range   Sodium 138 135 - 146 mmol/L   Potassium 4.4 3.5 - 5.3 mmol/L   Chloride 105 98 - 110 mmol/L   CO2 25 20 - 31 mmol/L   Glucose, Bld 109 (H) 65 - 99 mg/dL   BUN 12 7 - 25 mg/dL   Creat 0.90 0.70 - 1.25 mg/dL     Comment:       For patients > or = 64 years of age: The upper reference limit for Creatinine is approximately 13% higher for people identified as African-American.      Total Bilirubin 0.7 0.2 - 1.2 mg/dL   Alkaline Phosphatase 67 40 - 115 U/L   AST 18 10 - 35 U/L   ALT 19 9 - 46 U/L   Total Protein 7.1 6.1 - 8.1 g/dL   Albumin 4.0 3.6 - 5.1 g/dL   Calcium 9.0 8.6 - 10.3 mg/dL   GFR, Est African American >89 >=60 mL/min   GFR, Est Non African American >89 >=60 mL/min   Narrative   Performed at:  [facility]                [address]                [place], [place] [address]  CBC with Differential/Platelet  Result Value Ref Range   WBC 6.5 3.8 - 10.8 K/uL   RBC 5.25 4.20 - 5.80 MIL/uL   Hemoglobin 15.3 13.2 - 17.1 g/dL   HCT 43.3 38.5 - 50.0 %   MCV 82.5 80.0 - 100.0 fL   MCH 29.1 27.0 - 33.0 pg   MCHC 35.3 32.0 - 36.0 g/dL   RDW 13.0 11.0 - 15.0 %   Platelets 277 140 - 400 K/uL   MPV 9.4 7.5 - 12.5 fL   Neutro Abs 3,510 1,500 - 7,800 cells/uL   Lymphs Abs 2,145 850 - 3,900 cells/uL   Monocytes Absolute 585 200 - 950 cells/uL   Eosinophils Absolute 195 15 - 500 cells/uL   Basophils Absolute 65 0 - 200 cells/uL   Neutrophils Relative % 54 %   Lymphocytes Relative 33 %   Monocytes Relative 9 %   Eosinophils Relative 3 %   Basophils Relative 1 %   Smear Review Criteria for review not met    Narrative   Performed at:  [address]                [place], [place] [address]  Lipid panel  Result Value Ref Range   Cholesterol 155 125  - 200 mg/dL   Triglycerides 80 <150 mg/dL   HDL 46 >=40 mg/dL   Total CHOL/HDL Ratio 3.4 <=5.0 Ratio   VLDL 16 <30 mg/dL   LDL Cholesterol 93 <130 mg/dL     Comment:       Total Cholesterol/HDL Ratio:CHD Risk                        Coronary Heart Disease Risk Table  Men       Women          1/2 Average Risk              3.4        3.3              Average Risk              5.0        4.4           2X Average Risk              9.6        7.1           3X Average Risk             23.4       11.0 Use the calculated Patient Ratio above and the CHD Risk table  to determine the patient's CHD Risk.    Narrative   Performed at:  [facility]                [address]                [place], [place] [address]  TSH  Result Value Ref Range   TSH 0.52 0.40 - 4.50 mIU/L   Narrative   Performed at:  [address]                [place], [place] [address]     If you have any questions or concerns, please don't hesitate to call.  Sincerely,   Molli Barrows, FNP

## 2016-02-03 DIAGNOSIS — R030 Elevated blood-pressure reading, without diagnosis of hypertension: Principal | ICD-10-CM

## 2016-02-03 DIAGNOSIS — IMO0001 Reserved for inherently not codable concepts without codable children: Secondary | ICD-10-CM | POA: Insufficient documentation

## 2016-07-02 ENCOUNTER — Ambulatory Visit: Payer: 59

## 2016-07-05 ENCOUNTER — Ambulatory Visit (INDEPENDENT_AMBULATORY_CARE_PROVIDER_SITE_OTHER): Payer: 59 | Admitting: Family Medicine

## 2016-07-05 ENCOUNTER — Telehealth: Payer: Self-pay | Admitting: *Deleted

## 2016-07-05 VITALS — BP 118/76 | HR 90 | Temp 98.4°F | Resp 16 | Ht 64.0 in | Wt 178.4 lb

## 2016-07-05 DIAGNOSIS — Z8639 Personal history of other endocrine, nutritional and metabolic disease: Secondary | ICD-10-CM

## 2016-07-05 DIAGNOSIS — H9202 Otalgia, left ear: Secondary | ICD-10-CM

## 2016-07-05 DIAGNOSIS — I10 Essential (primary) hypertension: Secondary | ICD-10-CM

## 2016-07-05 LAB — POCT GLYCOSYLATED HEMOGLOBIN (HGB A1C): HEMOGLOBIN A1C: 6.6

## 2016-07-05 MED ORDER — AMLODIPINE BESYLATE 5 MG PO TABS: 5.0000 mg | ORAL_TABLET | Freq: Every day | ORAL | 2 refills | Status: DC

## 2016-07-05 MED ORDER — SIMVASTATIN 20 MG PO TABS: ORAL_TABLET | ORAL | 2 refills | Status: DC

## 2016-07-05 MED ORDER — METFORMIN HCL 500 MG PO TABS: 500.0000 mg | ORAL_TABLET | Freq: Two times a day (BID) | ORAL | 3 refills | Status: DC

## 2016-07-05 NOTE — Patient Instructions (Addendum)
I am increasing your Amlodipine to 5 mg once daily.  If pressures are persistently greater than 140/90, you may increase dose to 10 mg daily.  Your hemoglobin A1C is 6.6 today which indicates diabetes. I would like to start you on Metformin today. I am also supplying you with a meter to occasionally monitor your blood sugars.    IF you received an x-ray today, you will receive an invoice from [facility]. Please contact [facility] at [phone] with questions or concerns regarding your invoice.   IF you received labwork today, you will receive an invoice from [place]. Please contact [facility] at [phone] with questions or concerns regarding your invoice.   Our billing staff will not be able to assist you with questions regarding bills from these companies.  You will be contacted with the lab results as soon as they are available. The fastest way to get your results is to activate your My Chart account. Instructions are located on the last page of this paperwork. If you have not heard from [place] regarding the results in 2 weeks, please contact this office.    Hypertension Hypertension is another name for high blood pressure. High blood pressure forces your heart to work harder to pump blood. A blood pressure reading has two numbers, which includes a higher number over a lower number (example: 110/72). Follow these instructions at home:  Have your blood pressure rechecked by your doctor.  Only take medicine as told by your doctor. Follow the directions carefully. The medicine does not work as well if you skip doses. Skipping doses also puts you at risk for problems.  Do not smoke.  Monitor your blood pressure at home as told by your doctor. Contact a doctor if:  You think you are having a reaction to the medicine you are taking.  You have repeat headaches or feel dizzy.  You have puffiness (swelling) in your ankles.  You have trouble with your vision. Get  help right away if:  You get a very bad headache and are confused.  You feel weak, numb, or faint.  You get chest or belly (abdominal) pain.  You throw up (vomit).  You cannot breathe very well. This information is not intended to replace advice given to you by your health care provider. Make sure you discuss any questions you have with your health care provider. Document Released: 10/23/2007 Document Revised: 10/12/2015 Document Reviewed: 02/26/2013 Elsevier Interactive Patient Education  2017 [employer] [place].  Diabetes Mellitus and Exercise Exercising regularly is important for your overall health, especially when you have diabetes (diabetes mellitus). Exercising is not only about losing weight. It has many health benefits, such as increasing muscle strength and bone density and reducing body fat and stress. This leads to improved fitness, flexibility, and endurance, all of which result in better overall health. Exercise has additional benefits for people with diabetes, including:  Reducing appetite.  Helping to lower and control blood glucose.  Lowering blood pressure.  Helping to control amounts of fatty substances (lipids) in the blood, such as cholesterol and triglycerides.  Helping the body to respond better to insulin (improving insulin sensitivity).  Reducing how much insulin the body needs.  Decreasing the risk for heart disease by:  Lowering cholesterol and triglyceride levels.  Increasing the levels of good cholesterol.  Lowering blood glucose levels. What is my activity plan? Your health care provider or certified diabetes educator can help you make a plan for the type and frequency of exercise (activity  plan) that works for you. Make sure that you:  Do at least 150 minutes of moderate-intensity or vigorous-intensity exercise each week. This could be brisk walking, biking, or water aerobics.  Do stretching and strength exercises, such as yoga or weightlifting, at  least 2 times a week.  Spread out your activity over at least 3 days of the week.  Get some form of physical activity every day.  Do not go more than 2 days in a row without some kind of physical activity.  Avoid being inactive for more than 90 minutes at a time. Take frequent breaks to walk or stretch.  Choose a type of exercise or activity that you enjoy, and set realistic goals.  Start slowly, and gradually increase the intensity of your exercise over time. What do I need to know about managing my diabetes?  Check your blood glucose before and after exercising.  If your blood glucose is higher than 240 mg/dL (13.3 mmol/L) before you exercise, check your urine for ketones. If you have ketones in your urine, do not exercise until your blood glucose returns to normal.  Know the symptoms of low blood glucose (hypoglycemia) and how to treat it. Your risk for hypoglycemia increases during and after exercise. Common symptoms of hypoglycemia can include:  Hunger.  Anxiety.  Sweating and feeling clammy.  Confusion.  Dizziness or feeling light-headed.  Increased heart rate or palpitations.  Blurry vision.  Tingling or numbness around the mouth, lips, or tongue.  Tremors or shakes.  Irritability.  Keep a rapid-acting carbohydrate snack available before, during, and after exercise to help prevent or treat hypoglycemia.  Avoid injecting insulin into areas of the body that are going to be exercised. For example, avoid injecting insulin into:  The arms, when playing tennis.  The legs, when jogging.  Keep records of your exercise habits. Doing this can help you and your health care provider adjust your diabetes management plan as needed. Write down:  Food that you eat before and after you exercise.  Blood glucose levels before and after you exercise.  The type and amount of exercise you have done.  When your insulin is expected to peak, if you use insulin. Avoid exercising  at times when your insulin is peaking.  When you start a new exercise or activity, work with your health care provider to make sure the activity is safe for you, and to adjust your insulin, medicines, or food intake as needed.  Drink plenty of water while you exercise to prevent dehydration or heat stroke. Drink enough fluid to keep your urine clear or pale yellow. This information is not intended to replace advice given to you by your health care provider. Make sure you discuss any questions you have with your health care provider. Document Released: 07/27/2003 Document Revised: 11/24/2015 Document Reviewed: 10/16/2015 Elsevier Interactive Patient Education  2017 [employer].

## 2016-07-05 NOTE — Progress Notes (Signed)
Patient ID: Johnny Sheppard, male    DOB: 08/22/1950, 66 y.o.   MRN: [id number]  PCP: Leotis Pain, DO  Chief Complaint  Patient presents with  . Hypertension    States systolic pressure have been in the 150s at home on his BP monitor  . Ear Pain    Left     Subjective:  HPI 66 year old male presents for hypertension follow-up and left ear fullness.  Past medical hx includes:   Hypertension, prior elevated blood sugar without a diagnosis of hypertension .  Hypertension Report medication compliance since Amlodipine was prescribed. 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 Denies chest pain, headache, dizziness. He reports blurred vision occasionally which occurs up to twice per month. He has been recently evaluated by a opthalmology and was told that he had cataracts.   Left Ear Pain and Fullness Over the last few days has felt mild tenderness and fullness of left ear.  Previously prescribed ear drops in the past-currently out of medication.  Prior Elevated Glucose During previous visit glucose was elevated.  Check A1C today. Pt denies urinary frequency, thirst, or increased hunger. Reports fatigue, feels this is related to long work hours.   Social History   Social History  . Marital status: Married    Spouse name: N/A  . Number of children: N/A  . Years of education: N/A   Occupational History  . Not on file.   Social History Main Topics  . Smoking status: Former Smoker    Packs/day: 1.00    Years: 40.00    Types: Cigarettes    Quit date: 05/20/1994  . Smokeless tobacco: Never Used  . Alcohol use No  . Drug use: No  . Sexual activity: Yes   Other Topics Concern  . Not on file   Social History Narrative  . No narrative on file    Family History    Problem Relation Age of Onset  . Hypertension Sister    Review of Systems See HPI  Patient Active Problem List   Diagnosis Date Noted  . Elevated blood pressure 02/03/2016  . Hyperlipidemia 05/31/2012    No Known Allergies  Prior to Admission medications   Medication Sig Start Date End Date Taking? Authorizing Provider  amLODipine (NORVASC) 2.5 MG tablet Take 1 tablet (2.5 mg total) by mouth daily. 01/30/16  Yes Sedalia Muta, FNP  aspirin 81 MG tablet Take 81 mg by mouth daily.   Yes Historical Provider, MD  simvastatin (ZOCOR) 20 MG tablet TAKE ONE TABLET BY MOUTH IN THE EVENING. 01/30/16  Yes Sedalia Muta, FNP  acetic acid-hydrocortisone (VOSOL-HC) otic solution Place 3 drops into both ears 2 (two) times daily as needed. Patient not taking: Reported on 07/05/2016 08/13/15   Shawnee Knapp, MD    Past Medical, Surgical Family and Social History reviewed and updated.    Objective:   Today's Vitals   07/05/16 1544  BP: 118/76  Pulse: 90  Resp: 16  Temp: 98.4 F (36.9 C)  TempSrc: Oral  SpO2: 98%  Weight: 178 lb 6.4 oz (80.9 kg)  Height: 5\' 4"  (1.626 m)    Wt Readings from Last 3 Encounters:  07/05/16 178 lb 6.4 oz (80.9 kg)  01/30/16 182 lb (82.6 kg)  08/13/15 178 lb (  80.7 kg)   Physical Exam  Constitutional: He appears well-developed and well-nourished.  HENT:  Right Ear: Hearing, tympanic membrane, external ear and ear canal normal.  Left Ear: Hearing, tympanic membrane, external ear and ear canal normal.  Eyes: Pupils are equal, round, and reactive to light.  Neck: Normal range of motion. Neck supple. No JVD present.  Cardiovascular: Normal rate, regular rhythm, normal heart sounds and intact distal pulses.   Pulmonary/Chest: Effort normal and breath sounds normal.  Musculoskeletal: Normal range of motion.  Lymphadenopathy:    He has no cervical adenopathy.  Psychiatric: He has a normal mood and affect. His behavior is normal. Judgment  and thought content normal.     Assessment & Plan:  1. Hypertension, unspecified type, good reading today, patient reports elevated readings greater than 3 times weekly [id number] and [id number] systolic.  Plan: -Continue to monitor blood pressure at home and keep log of readings -Increased Amlodipine 5 mg. If pressure are persistently greater than 140/90, increase dose to 10 mg by taking two tablets.  2. History of elevated glucose - POCT glycosylated hemoglobin (Hb A1C)-6.6 Plan: -Start Metformin 500 mg twice daily with food.  -Provided meter. Not necessary to check blood sugar routinely. Advised to check blood sugar if not feeling well, experienced excessive thirst, hunger, or increased urination.  3. Ear pain, left -Resume Vosol otic drops, 3 times per day PRN   Sent letter to call and schedule follow-up with Dr. Nolon Rod. I neglected to advise patient of follow-up prior to discharge.   Carroll Sage. Kenton Kingfisher, MSN, FNP-C [facility] [place]

## 2016-07-05 NOTE — Telephone Encounter (Signed)
Faxed Rx for amlodipine 5 mg to [facility] [address], per Joelene Millin Harris,FNP.

## 2016-07-10 MED ORDER — HYDROCORTISONE-ACETIC ACID 1-2 % OT SOLN: 3.0000 [drp] | Freq: Two times a day (BID) | OTIC | 5 refills | Status: DC | PRN

## 2016-12-02 ENCOUNTER — Ambulatory Visit (INDEPENDENT_AMBULATORY_CARE_PROVIDER_SITE_OTHER): Payer: 59 | Admitting: Physician Assistant

## 2016-12-02 ENCOUNTER — Ambulatory Visit (INDEPENDENT_AMBULATORY_CARE_PROVIDER_SITE_OTHER): Payer: 59

## 2016-12-02 ENCOUNTER — Encounter: Payer: Self-pay | Admitting: Physician Assistant

## 2016-12-02 VITALS — BP 125/70 | HR 75 | Temp 98.9°F | Resp 18 | Ht 64.0 in | Wt 174.6 lb

## 2016-12-02 DIAGNOSIS — M25561 Pain in right knee: Secondary | ICD-10-CM

## 2016-12-02 NOTE — Patient Instructions (Addendum)
If the pain is keeping you awake, try acetaminophen (Tylenol). If the pain worsens/persists, please come back for re-evaluation.    IF you received an x-ray today, you will receive an invoice from [facility]. Please contact [facility] at [phone] with questions or concerns regarding your invoice.   IF you received labwork today, you will receive an invoice from [facility]. Please contact [facility] at [phone] with questions or concerns regarding your invoice.   Our billing staff will not be able to assist you with questions regarding bills from these companies.  You will be contacted with the lab results as soon as they are available. The fastest way to get your results is to activate your My Chart account. Instructions are located on the last page of this paperwork. If you have not heard from [place] regarding the results in 2 weeks, please contact this office.

## 2016-12-02 NOTE — Progress Notes (Signed)
Subjective:    Patient ID: Johnny Sheppard, male    DOB: [birth year], 66 y.o.   MRN: [id number] PCP: Forrest Moron, MD   HPI Johnny and [name] is a 66 year old male presenting for evaluation of right knee pain.  He noticed a tightness behind his RIGHT knee 4 days ago.  Tightness is often worse at the end of the day. Describes it as tight and throbbing. Denies any pain. No alleviating factors that he can describe. Has not tried any medications - he prefers to stay away from medications as much as possible. Felt like it was so tight that he couldn't straighten leg out while laying down last night.  He wears a brace on his RIGHT knee occasionally for chronic anterior knee pain. He has done this for years. He went on a trip two weeks ago for the fourth of July. He did not bring his brace with him because his knee wasn't bothering him. He ended up having to purchase a new brace from [facility] because it started to bother him. He worse this brace for a few days before he came home and switched back to his old one, though he reports they were very similar.  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  Denies fever, chills, swelling in legs, difficulty breathing, chest pain. Denies numbness, tingling, or weakness in extremities. Denies any other arthralgias or myalgias.  He was prescribed metformin due to a HgbA1c reading of 6.6%. He has not taken it because he has been working on adjusting his diet and lifestyle and prefers not to take medication.  He wears a compression stocking on his left lower leg because it swells up sometimes. He has done this for 15+ years. Denies pain.   Patient Active Problem List   Diagnosis Date Noted  . Elevated blood pressure 02/03/2016  . Hyperlipidemia 05/31/2012   Prior to Admission medications   Medication Sig Start Date End Date Taking? Authorizing Provider  amLODipine (NORVASC) 5 MG tablet Take 1 tablet (5 mg total) by mouth daily. 07/05/16  Yes Scot Jun, FNP  aspirin 81 MG tablet Take 81 mg by mouth daily.   Yes [provider]  simvastatin (ZOCOR) 20 MG tablet TAKE ONE TABLET BY MOUTH IN THE EVENING. 07/05/16  Yes Scot Jun, FNP   No Known Allergies  Review of Systems See above.    Objective:   Physical Exam  Constitutional: He appears well-developed and well-nourished. No distress.  BP 125/70 (BP Location: Right Arm, Patient Position: Sitting, Cuff Size: Normal)   Pulse 75   Temp 98.9 F (37.2 C) (Oral)   Resp 18   Ht 5\' 4"  (1.626 m)   Wt 174 lb 9.6 oz (79.2 kg)   SpO2 98%   BMI 29.97 kg/m    HENT:  Head: Normocephalic and atraumatic.  Right Ear: External ear normal.  Left Ear: External ear normal.  Eyes: EOM are normal.  Neck: Normal range of motion. Neck supple.  Cardiovascular: Normal rate, regular rhythm, normal heart sounds and intact distal pulses.   Pulses:      Radial pulses are 2+ on the right side, and 2+ on the left side.       Dorsalis pedis pulses are 2+ on the right side.       Posterior tibial pulses are 2+ on the right side.  Pulmonary/Chest: Effort normal and breath sounds normal. No respiratory distress.  Neurological: He is alert. He has normal strength. No  sensory deficit.  Reflex Scores:      Patellar reflexes are 2+ on the right side and 2+ on the left side.      Achilles reflexes are 2+ on the right side and 2+ on the left side. Skin: Skin is warm and dry.  Psychiatric: He has a normal mood and affect. His behavior is normal.        Assessment & Plan:   Right knee pain, unspecified chronicity - Plan: DG Knee Complete 4 Views Right Radiographs consistent with osteoarthritic changes. Pt refuses trial of anti-inflammatories for acute onset of knee pain and tightness. Encouraged follow up in 1 month, sooner with failure to improve.    Respectfully, Weldon Picking, PA-S

## 2016-12-02 NOTE — Progress Notes (Signed)
Patient ID: Johnny Sheppard, male    DOB: [birth year], 66 y.o.   MRN: [id number]  PCP: Forrest Moron, MD  Chief Complaint  Patient presents with  . Knee Pain     x3 days, behind the right knee, per pt wears a brace on the right knee PRN and thinks the pain be coming from that.,    Subjective:   Presents for evaluation of RIGHT knee pain x 3 days.   He describes tightness and throbiing located in the back of the knee, worse at the end of the day. Sometimes the tightness is such that he can't fully extend the knee. He has tried no medications to alleviate the discomfort, preferring to avoid medication whenever possible. No trauma/injury recalled.  No weakness, giving way. No numbness or tingling. No pain extending into the thigh or lower leg.  He reports chronic RIGHT knee pain, anteriorly, for which he wears a hinged knee brace. 2 weeks ago he travelled and left the brace at home inadvertently. He purchased a new brace at a local pharmacy to wear, but went back to his previous brace upon arriving at home. The chronic pain is not worse with the new discomfort.  In addition, he reports chronic LEFT LE edema, for which he wears a compression stocking.  Last labs reveal A1C 6.6%, and he was prescribed metformin, but has not started it.   Review of Systems As above.    Patient Active Problem List   Diagnosis Date Noted  . Elevated blood pressure 02/03/2016  . Hyperlipidemia 05/31/2012     Prior to Admission medications   Medication Sig Start Date End Date Taking? Authorizing Provider  amLODipine (NORVASC) 5 MG tablet Take 1 tablet (5 mg total) by mouth daily. 07/05/16  Yes Scot Jun, FNP  aspirin 81 MG tablet Take 81 mg by mouth daily.   Yes [provider]  simvastatin (ZOCOR) 20 MG tablet TAKE ONE TABLET BY MOUTH IN THE EVENING. 07/05/16  Yes Scot Jun, FNP     No Known Allergies     Objective:  Physical Exam  Constitutional: He is  oriented to person, place, and time. He appears well-developed and well-nourished. He is active and cooperative. No distress.  BP 125/70 (BP Location: Right Arm, Patient Position: Sitting, Cuff Size: Normal)   Pulse 75   Temp 98.9 F (37.2 C) (Oral)   Resp 18   Ht 5\' 4"  (1.626 m)   Wt 174 lb 9.6 oz (79.2 kg)   SpO2 98%   BMI 29.97 kg/m   HENT:  Head: Normocephalic and atraumatic.  Right Ear: Hearing normal.  Left Ear: Hearing normal.  Eyes: Conjunctivae are normal. No scleral icterus.  Neck: Normal range of motion. Neck supple. No thyromegaly present.  Cardiovascular: Normal rate, regular rhythm and normal heart sounds.   Pulses:      Radial pulses are 2+ on the right side, and 2+ on the left side.  Pulmonary/Chest: Effort normal and breath sounds normal.  Musculoskeletal:       Right knee: He exhibits normal range of motion, no swelling, no effusion, no ecchymosis, no deformity, no laceration, no erythema, normal alignment, no LCL laxity, normal patellar mobility, no bony tenderness, normal meniscus and no MCL laxity. No tenderness found.       Legs: Lymphadenopathy:       Head (right side): No tonsillar, no preauricular, no posterior auricular and no occipital adenopathy present.  Head (left side): No tonsillar, no preauricular, no posterior auricular and no occipital adenopathy present.    He has no cervical adenopathy.       Right: No supraclavicular adenopathy present.       Left: No supraclavicular adenopathy present.  Neurological: He is alert and oriented to person, place, and time. No sensory deficit.  Skin: Skin is warm, dry and intact. No rash noted. No cyanosis or erythema. Nails show no clubbing.  Psychiatric: He has a normal mood and affect. His speech is normal and behavior is normal.       Dg Knee Complete 4 Views Right  Result Date: 12/02/2016 CLINICAL DATA:  Right knee pain. EXAM: RIGHT KNEE - COMPLETE 4+ VIEW COMPARISON:  08/13/2015 FINDINGS: Stable  moderate tricompartmental degenerative changes but no acute bony findings or osteochondral lesion. No joint effusion. IMPRESSION: Stable appearing moderate tricompartmental degenerative changes but no acute findings. Electronically Signed   By: Marijo Sanes M.D.   On: 12/02/2016 13:33       Assessment & Plan:   1. Right knee pain, unspecified chronicity Radiographs reveal stable OA, but no specific cause for his current discomfort. He isn't interested in prescription medication or referral. He'll use OTC ibuprofen or acetaminophen if needed, and if this is still bothering him in 4 weeks, will discuss it with his PCP at routine follow-up visit. - DG Knee Complete 4 Views Right; Future    Return in about 4 weeks (around 12/30/2016) for re-evaluation of cholesterol, elevated A1C, etc .   Fara Chute, PA-C [facility] [place]

## 2016-12-27 ENCOUNTER — Ambulatory Visit: Payer: 59 | Admitting: Family Medicine

## 2017-01-01 ENCOUNTER — Ambulatory Visit: Payer: 59 | Admitting: Family Medicine

## 2017-02-20 ENCOUNTER — Ambulatory Visit (INDEPENDENT_AMBULATORY_CARE_PROVIDER_SITE_OTHER): Payer: Self-pay | Admitting: Physician Assistant

## 2017-02-20 ENCOUNTER — Encounter: Payer: Self-pay | Admitting: Physician Assistant

## 2017-02-20 VITALS — BP 116/64 | HR 72 | Temp 98.9°F | Resp 16 | Ht 63.78 in | Wt 171.4 lb

## 2017-02-20 DIAGNOSIS — Z0289 Encounter for other administrative examinations: Secondary | ICD-10-CM

## 2017-02-20 NOTE — Progress Notes (Signed)
G[employer] Medical Examination   [name] Sheppard is a 66 y.o. male with a pertinent medical history of well controlled HTN who presents today for a commercial driver fitness determination physical exam. Was recently diagnosed with HTN on 2/16 and started on a medium dose of Norvasc.The patient reports no problems today. In the past the patient reports receiving 2 year certificates. He denies focal neurological deficits, vision and hearing changes. He denies the habitual use of benzodiazepines, opioids, amphetamines and denies illicit drug use. He is a former smoker. Takes medication for dyslipidemia. No complaints today.   Current medications, family history, allergies, social history reviewed by me and exist elsewhere in the encounter.    Review of Systems  Constitutional: Negative for chills, diaphoresis and fever.  Eyes: Negative.   Respiratory: Negative for cough, hemoptysis, sputum production, shortness of breath and wheezing.   Cardiovascular: Negative for chest pain, orthopnea and leg swelling.  Gastrointestinal: Negative for abdominal pain, blood in stool, constipation, diarrhea, heartburn, melena, nausea and vomiting.  Genitourinary: Negative for dysuria, flank pain, frequency, hematuria and urgency.  Skin: Negative for rash.  Neurological: Negative for dizziness, sensory change, speech change, focal weakness and headaches.    Objective:     Vision/hearing:  Visual Acuity Screening   Right eye Left eye Both eyes  Without correction:     With correction: 20/25 20/15 20/25   Comments: Horizontal Field of Vision: 70 bilateral Color Vision: Passed  Hearing Screening Comments: WHISPER TEST: passed at 10 feet, bilateral  Applicant can recognize and distinguish among traffic control signals and devices showing standard red, green, and amber colors.  Corrective lenses required: Yes  Monocular Vision?: No  Hearing aid requirement: No  Physical Exam  Constitutional:  He is oriented to person, place, and time. He appears well-developed. He is active and cooperative.  Non-toxic appearance.  HENT:  Right Ear: Hearing, tympanic membrane, external ear and ear canal normal.  Left Ear: Hearing, tympanic membrane, external ear and ear canal normal.  Nose: Nose normal. Right sinus exhibits no maxillary sinus tenderness and no frontal sinus tenderness. Left sinus exhibits no maxillary sinus tenderness and no frontal sinus tenderness.  Mouth/Throat: Uvula is midline, oropharynx is clear and moist and mucous membranes are normal. No oropharyngeal exudate, posterior oropharyngeal edema or tonsillar abscesses.  Eyes: Pupils are equal, round, and reactive to light. Conjunctivae and EOM are normal.  Cardiovascular: Normal rate, regular rhythm, S1 normal, S2 normal, normal heart sounds, intact distal pulses and normal pulses.  Exam reveals no gallop and no friction rub.   No murmur heard. Pulmonary/Chest: Effort normal. No stridor. No tachypnea. No respiratory distress. He has no wheezes. He has no rales.  Abdominal: Soft. Normal appearance and bowel sounds are normal. He exhibits no distension and no mass. There is no tenderness. There is no rigidity, no rebound, no guarding and no CVA tenderness. No hernia.  Musculoskeletal: He exhibits no edema.  Lymphadenopathy:       Head (right side): No submandibular and no tonsillar adenopathy present.       Head (left side): No submandibular and no tonsillar adenopathy present.    He has no cervical adenopathy.  Neurological: He is alert and oriented to person, place, and time. He has normal strength and normal reflexes. He is not disoriented. No cranial nerve deficit or sensory deficit. He exhibits normal muscle tone. Coordination and gait normal.  Skin: Skin is warm and dry. He is not diaphoretic. No pallor.  Psychiatric: His  behavior is normal.  Vitals reviewed.   BP 116/64 (BP Location: Left Arm, Patient Position: Sitting, Cuff  Size: Normal)   Pulse 72   Temp 98.9 F (37.2 C) (Oral)   Resp 16   Ht 5' 3.78" (1.62 m)   Wt 171 lb 6.4 oz (77.7 kg)   SpO2 98%   BMI 29.62 kg/m   Lab Results  Component Value Date   CHOL 155 01/30/2016   HDL 46 01/30/2016   LDLCALC 93 01/30/2016   TRIG 80 01/30/2016   CHOLHDL 3.4 01/30/2016   BP Readings from Last 3 Encounters:  02/20/17 116/64  12/02/16 125/70  07/05/16 118/76     Labs:  Comments: UA:  SG-1.020; Protein-Neg; Blood-Neg; Sugar-Neg  Assessment:    Healthy male exam.  Meets standards, but periodic monitoring required due to history of well controlled HTN.  Driver qualified only for 1 year.    Plan:    Medical examiners certificate completed and printed. Return as needed.    Philis Fendt, MS, PA-C 2:34 PM, 02/20/2017

## 2017-02-20 NOTE — Patient Instructions (Signed)
     IF you received an x-ray today, you will receive an invoice from [facility]. Please contact [facility] at [phone] with questions or concerns regarding your invoice.   IF you received labwork today, you will receive an invoice from [facility]. Please contact [facility] at [phone] with questions or concerns regarding your invoice.   Our billing staff will not be able to assist you with questions regarding bills from these companies.  You will be contacted with the lab results as soon as they are available. The fastest way to get your results is to activate your My Chart account. Instructions are located on the last page of this paperwork. If you have not heard from us regarding the results in 2 weeks, please contact this office.     

## 2017-03-11 ENCOUNTER — Other Ambulatory Visit: Payer: Self-pay | Admitting: Family Medicine

## 2017-03-27 ENCOUNTER — Telehealth: Payer: Self-pay | Admitting: Family Medicine

## 2017-03-27 ENCOUNTER — Telehealth: Payer: Self-pay

## 2017-03-27 ENCOUNTER — Telehealth: Payer: Self-pay | Admitting: Physician Assistant

## 2017-03-27 MED ORDER — AMLODIPINE BESYLATE 5 MG PO TABS: 5.0000 mg | ORAL_TABLET | Freq: Every day | ORAL | 0 refills | Status: DC

## 2017-03-27 NOTE — Telephone Encounter (Signed)
Called pt to find out which pharmacy to fax his amlodipine 5 mg tab to.  Pt advises to fax to [facility] [address] [facility] as it is [facility] expensive there- he advises his insurance [facility] and wants rx sent there today. Advised will fax to [facility]. Dgaddy, CMA

## 2017-03-27 NOTE — Telephone Encounter (Signed)
Called pt. Back and changed pharmacy as requested.

## 2017-03-27 NOTE — Telephone Encounter (Signed)
Copied from [place] #[id number]. >> Mar 27, 2017  2:35 PM Neva Seat wrote: Pt is wanting All meds [facility] in at[facility]  [facility]  [place]   Pt has BP medication that was just called in to [facility].  He wants this moved to [facility] on [address].

## 2017-03-27 NOTE — Telephone Encounter (Signed)
Chelle - Pt needs refill on his BP med.  I told him it may require an appointment.  [phone]

## 2017-04-28 ENCOUNTER — Other Ambulatory Visit: Payer: Self-pay | Admitting: Family Medicine

## 2017-04-29 ENCOUNTER — Other Ambulatory Visit: Payer: Self-pay | Admitting: *Deleted

## 2017-04-29 MED ORDER — AMLODIPINE BESYLATE 5 MG PO TABS: 5.0000 mg | ORAL_TABLET | Freq: Every day | ORAL | 0 refills | Status: DC

## 2017-05-02 ENCOUNTER — Telehealth: Payer: Self-pay

## 2017-05-02 ENCOUNTER — Other Ambulatory Visit: Payer: Self-pay | Admitting: Family Medicine

## 2017-05-02 NOTE — Telephone Encounter (Signed)
Copied from [place] [id number]. Topic: Quick Communication - See Telephone Encounter >> May 02, 2017  1:44 PM Burnis Medin, NT wrote: CRM for notification. See Telephone encounter for: Pt is calling for a medication refill for amLODipine (NORVASC) 5 MG tablet. Pt uses [facility] [address]  05/02/17.

## 2017-05-02 NOTE — Telephone Encounter (Signed)
Norvasc refill done on 04/29/17.

## 2017-05-02 NOTE — Telephone Encounter (Signed)
Called pt and left VM that pt needs office visit to get refill for amlodipine. Last rx refill was done in error and should not be resent until pt schedules OV. Advised on VM that we would be able to cover patient once appt for OV is made.

## 2017-05-05 ENCOUNTER — Other Ambulatory Visit: Payer: Self-pay

## 2017-05-05 ENCOUNTER — Encounter: Payer: Self-pay | Admitting: Physician Assistant

## 2017-05-05 ENCOUNTER — Ambulatory Visit (INDEPENDENT_AMBULATORY_CARE_PROVIDER_SITE_OTHER): Payer: Medicare Other | Admitting: Physician Assistant

## 2017-05-05 VITALS — BP 114/68 | HR 75 | Temp 98.7°F | Resp 16 | Ht 63.75 in | Wt 180.6 lb

## 2017-05-05 DIAGNOSIS — E119 Type 2 diabetes mellitus without complications: Secondary | ICD-10-CM

## 2017-05-05 DIAGNOSIS — E785 Hyperlipidemia, unspecified: Secondary | ICD-10-CM | POA: Diagnosis not present

## 2017-05-05 DIAGNOSIS — I10 Essential (primary) hypertension: Secondary | ICD-10-CM | POA: Diagnosis not present

## 2017-05-05 MED ORDER — SIMVASTATIN 20 MG PO TABS: ORAL_TABLET | ORAL | 3 refills | Status: DC

## 2017-05-05 MED ORDER — AMLODIPINE BESYLATE 5 MG PO TABS: 5.0000 mg | ORAL_TABLET | Freq: Every day | ORAL | 3 refills | Status: DC

## 2017-05-05 NOTE — Progress Notes (Signed)
05/05/2017 6:19 PM   DOB: Oct 16, 1950 / MRN: [id number]  SUBJECTIVE:  Johnny Sheppard is a 66 y.o. male presenting for refills of Norvasc and simvastatin. He feels well today and denies complaint.  Labs show he was a early diabetic about this time last year.  He tells me has been exercising and never filled the medication. CHL shows no EKG. He is a former smoker.   He has No Known Allergies.   He  has a past medical history of Arthritis, Asthma, H/O hematuria, and Hyperlipidemia.    He  reports that he quit smoking about 22 years ago. His smoking use included cigarettes. He has a 40.00 pack-year smoking history. he has never used smokeless tobacco. He reports that he does not drink alcohol or use drugs. He  reports that he currently engages in sexual activity. The patient  has a past surgical history that includes Colon surgery.  His family history includes Hypertension in his sister.  Review of Systems  Constitutional: Negative for chills, diaphoresis and fever.  Respiratory: Negative for cough, hemoptysis, sputum production, shortness of breath and wheezing.   Cardiovascular: Negative for chest pain, orthopnea and leg swelling.  Gastrointestinal: Negative for nausea.  Skin: Negative for rash.  Neurological: Negative for dizziness, sensory change, speech change, focal weakness and headaches.    The problem list and medications were reviewed and updated by myself where necessary and exist elsewhere in the encounter.   OBJECTIVE:  BP 114/68 (BP Location: Left Arm, Patient Position: Sitting, Cuff Size: Large)   Pulse 75   Temp 98.7 F (37.1 C) (Oral)   Resp 16   Ht 5' 3.75" (1.619 m)   Wt 180 lb 9.6 oz (81.9 kg)   SpO2 98%   BMI 31.24 kg/m   Physical Exam  Constitutional: He appears well-developed. He is active and cooperative.  Non-toxic appearance.  Cardiovascular: Normal rate, regular rhythm, S1 normal, S2 normal, normal heart sounds, intact distal pulses and normal pulses.  Exam reveals no gallop and no friction rub.  No murmur heard. Pulmonary/Chest: Effort normal. No stridor. No tachypnea. No respiratory distress. He has no wheezes. He has no rales.  Abdominal: He exhibits no distension.  Musculoskeletal: He exhibits no edema.  Neurological: He is alert.  Skin: Skin is warm and dry. He is not diaphoretic. No pallor.  Vitals reviewed.   No results found for this or any previous visit (from the past 72 hour(s)).  No results found.  ASSESSMENT AND PLAN:  Johnny and Johnny Sheppard was seen today for medication refill.  Diagnoses and all orders for this visit:  Type 2 diabetes mellitus without complication, without long-term current use of insulin (Johnny Sheppard): A!c shows this has resolved.  -     Hemoglobin A1c  Essential hypertension: Well controlled.  -     TSH -     Renal Function Panel       -     amLODipine (NORVASC) 5 MG tablet; Take 1 tablet (5 mg total) by mouth daily  Dyslipidemia: Well controlled.  -     Lipid panel -     simvastatin (ZOCOR) 20 MG tablet; TAKE ONE TABLET BY MOUTH IN THE EVENING.   .    The patient is advised to call or return to clinic if he does not see an improvement in symptoms, or to seek the care of the closest emergency department if he worsens with the above plan.   Philis Fendt, MHS, PA-C Primary [facility] at [facility]  [facility] 05/05/2017 6:19 PM

## 2017-05-05 NOTE — Patient Instructions (Signed)
     IF you received an x-ray today, you will receive an invoice from [facility]. Please contact [facility] at [phone] with questions or concerns regarding your invoice.   IF you received labwork today, you will receive an invoice from [facility]. Please contact [facility] at [phone] with questions or concerns regarding your invoice.   Our billing staff will not be able to assist you with questions regarding bills from these companies.  You will be contacted with the lab results as soon as they are available. The fastest way to get your results is to activate your My Chart account. Instructions are located on the last page of this paperwork. If you have not heard from us regarding the results in 2 weeks, please contact this office.     

## 2017-05-05 NOTE — Progress Notes (Signed)
    05/05/2017 6:13 PM   DOB: [birth year] / MRN: [id number]  SUBJECTIVE:  Johnny Sheppard is a 66 y.o. male presenting for   He has No Known Allergies.   He  has a past medical history of Arthritis, Asthma, H/O hematuria, and Hyperlipidemia.    He  reports that he quit smoking about 22 years ago. His smoking use included cigarettes. He has a 40.00 pack-year smoking history. he has never used smokeless tobacco. He reports that he does not drink alcohol or use drugs. He  reports that he currently engages in sexual activity. The patient  has a past surgical history that includes Colon surgery.  His family history includes Hypertension in his sister.  ROS  The problem list and medications were reviewed and updated by myself where necessary and exist elsewhere in the encounter.   OBJECTIVE:  BP 114/68 (BP Location: Left Arm, Patient Position: Sitting, Cuff Size: Large)   Pulse 75   Temp 98.7 F (37.1 C) (Oral)   Resp 16   Ht 5' 3.75" (1.619 m)   Wt 180 lb 9.6 oz (81.9 kg)   SpO2 98%   BMI 31.24 kg/m   Wt Readings from Last 3 Encounters:  05/05/17 180 lb 9.6 oz (81.9 kg)  02/20/17 171 lb 6.4 oz (77.7 kg)  12/02/16 174 lb 9.6 oz (79.2 kg)     Physical Exam  No results found for this or any previous visit (from the past 72 hour(s)).  No results found.  ASSESSMENT AND PLAN:  [name] was seen today for medication refill.  Diagnoses and all orders for this visit:  Type 2 diabetes mellitus without complication, without long-term current use of insulin (H[facility]) -     Hemoglobin A1c  Essential hypertension -     TSH -     Renal Function Panel  Dyslipidemia -     Lipid panel  Other orders -     amLODipine (NORVASC) 5 MG tablet; Take 1 tablet (5 mg total) by mouth daily. -     simvastatin (ZOCOR) 20 MG tablet; TAKE ONE TABLET BY MOUTH IN THE EVENING.    The patient is advised to call or return to clinic if he does not see an improvement in symptoms, or to seek the care of  the closest emergency department if he worsens with the above plan.   Philis Fendt, MHS, PA-C [facility] at [facility] 05/05/2017 6:13 PM

## 2017-05-06 LAB — RENAL FUNCTION PANEL
ALBUMIN: 4.2 g/dL (ref 3.6–4.8)
BUN/Creatinine Ratio: 11 (ref 10–24)
BUN: 12 mg/dL (ref 8–27)
CO2: 26 mmol/L (ref 20–29)
CREATININE: 1.05 mg/dL (ref 0.76–1.27)
Calcium: 9.4 mg/dL (ref 8.6–10.2)
Chloride: 104 mmol/L (ref 96–106)
GFR calc Af Amer: 86 mL/min/{1.73_m2} (ref >59)
GFR, EST NON AFRICAN AMERICAN: 74 mL/min/{1.73_m2} (ref >59)
Glucose: 121 mg/dL — ABNORMAL HIGH (ref 65–99)
PHOSPHORUS: 4.4 mg/dL (ref 2.5–4.5)
POTASSIUM: 4.3 mmol/L (ref 3.5–5.2)
Sodium: 141 mmol/L (ref 134–144)

## 2017-05-06 LAB — LIPID PANEL
CHOL/HDL RATIO: 3.9 ratio (ref 0.0–5.0)
CHOLESTEROL TOTAL: 154 mg/dL (ref 100–199)
HDL: 39 mg/dL — AB (ref >39)
LDL Calculated: 51 mg/dL (ref 0–99)
TRIGLYCERIDES: 322 mg/dL — AB (ref 0–149)
VLDL Cholesterol Cal: 64 mg/dL — ABNORMAL HIGH (ref 5–40)

## 2017-05-06 LAB — HEMOGLOBIN A1C
Est. average glucose Bld gHb Est-mCnc: 120 mg/dL
Hgb A1c MFr Bld: 5.8 % — ABNORMAL HIGH (ref 4.8–5.6)

## 2017-05-06 LAB — TSH: TSH: 1.35 u[IU]/mL (ref 0.450–4.500)

## 2017-10-18 ENCOUNTER — Emergency Department (HOSPITAL_COMMUNITY): Payer: Medicare Other

## 2017-10-18 ENCOUNTER — Emergency Department (HOSPITAL_COMMUNITY)
Admission: EM | Admit: 2017-10-18 | Discharge: 2017-10-19 | Disposition: A | Discharge status: Home / Self Care | Payer: Medicare Other | Attending: Emergency Medicine | Admitting: Emergency Medicine

## 2017-10-18 ENCOUNTER — Encounter (HOSPITAL_COMMUNITY): Payer: Self-pay | Admitting: Emergency Medicine

## 2017-10-18 DIAGNOSIS — I10 Essential (primary) hypertension: Secondary | ICD-10-CM | POA: Diagnosis not present

## 2017-10-18 DIAGNOSIS — R0789 Other chest pain: Secondary | ICD-10-CM

## 2017-10-18 DIAGNOSIS — Z79899 Other long term (current) drug therapy: Secondary | ICD-10-CM | POA: Diagnosis not present

## 2017-10-18 DIAGNOSIS — Z7982 Long term (current) use of aspirin: Secondary | ICD-10-CM | POA: Insufficient documentation

## 2017-10-18 DIAGNOSIS — J45909 Unspecified asthma, uncomplicated: Secondary | ICD-10-CM | POA: Diagnosis not present

## 2017-10-18 DIAGNOSIS — M546 Pain in thoracic spine: Secondary | ICD-10-CM | POA: Diagnosis not present

## 2017-10-18 DIAGNOSIS — Z87891 Personal history of nicotine dependence: Secondary | ICD-10-CM | POA: Diagnosis not present

## 2017-10-18 DIAGNOSIS — R079 Chest pain, unspecified: Secondary | ICD-10-CM | POA: Diagnosis not present

## 2017-10-18 HISTORY — DX: Essential (primary) hypertension: I10

## 2017-10-18 LAB — BASIC METABOLIC PANEL
Anion gap: 7 (ref 5–15)
BUN: 11 mg/dL (ref 6–20)
CHLORIDE: 105 mmol/L (ref 101–111)
CO2: 26 mmol/L (ref 22–32)
CREATININE: 1.14 mg/dL (ref 0.61–1.24)
Calcium: 8.5 mg/dL — ABNORMAL LOW (ref 8.9–10.3)
GFR calc Af Amer: >60 mL/min (ref >60)
GFR calc non Af Amer: >60 mL/min (ref >60)
Glucose, Bld: 168 mg/dL — ABNORMAL HIGH (ref 65–99)
POTASSIUM: 3.5 mmol/L (ref 3.5–5.1)
Sodium: 138 mmol/L (ref 135–145)

## 2017-10-18 LAB — CBC
HEMATOCRIT: 37.6 % — AB (ref 39.0–52.0)
Hemoglobin: 13.4 g/dL (ref 13.0–17.0)
MCH: 28.8 pg (ref 26.0–34.0)
MCHC: 35.6 g/dL (ref 30.0–36.0)
MCV: 80.7 fL (ref 78.0–100.0)
PLATELETS: 291 10*3/uL (ref 150–400)
RBC: 4.66 MIL/uL (ref 4.22–5.81)
RDW: 12.8 % (ref 11.5–15.5)
WBC: 10.5 10*3/uL (ref 4.0–10.5)

## 2017-10-18 LAB — I-STAT TROPONIN, ED: Troponin i, poc: 0 ng/mL (ref 0.00–0.08)

## 2017-10-18 NOTE — ED Triage Notes (Signed)
Reports left sided chest pain that radiates into the left upper back and left shoulder.  Started yesterday.  Reports no cardiac history.

## 2017-10-19 ENCOUNTER — Emergency Department (HOSPITAL_COMMUNITY): Payer: Medicare Other

## 2017-10-19 ENCOUNTER — Encounter (HOSPITAL_COMMUNITY): Payer: Self-pay | Admitting: Radiology

## 2017-10-19 DIAGNOSIS — R0789 Other chest pain: Secondary | ICD-10-CM | POA: Diagnosis not present

## 2017-10-19 LAB — I-STAT TROPONIN, ED: TROPONIN I, POC: 0 ng/mL (ref 0.00–0.08)

## 2017-10-19 MED ORDER — IOPAMIDOL (ISOVUE-370) INJECTION 76%
100.0000 mL | Freq: Once | INTRAVENOUS | Status: AC | PRN
  Administered 2017-10-19: 100 mL via INTRAVENOUS

## 2017-10-19 MED ORDER — IOHEXOL 300 MG/ML  SOLN: 100.0000 mL | Freq: Once | INTRAMUSCULAR | Status: DC | PRN

## 2017-10-19 MED ORDER — IOPAMIDOL (ISOVUE-370) INJECTION 76%: 100.0000 mL | Freq: Once | INTRAVENOUS | Status: DC | PRN

## 2017-10-19 NOTE — ED Provider Notes (Signed)
[facility] Provider Note   CSN: [id number] Arrival date & time: 10/18/17  1946     History   Chief Complaint Chief Complaint  Patient presents with  . Chest Pain    HPI Johnny Sheppard is a 67 y.o. male.  She presents to the emergency department for evaluation of chest pain.  Patient has been experiencing left-sided chest pain and pain in his left upper back for more than 1 day.  He has noticed some worsening of the symptoms when he bends and twists, but otherwise pain has been constant and like an dull aching pain.  He has not had shortness of breath, heart palpitations.  He denies any injury.  He is a Programmer, systems.  He has not had any unilateral leg swelling no history of DVT/PE.     Past Medical History:  Diagnosis Date  . Arthritis   . Asthma   . H/O hematuria    Followed by urology  . Hyperlipidemia   . Hypertension     Patient Active Problem List   Diagnosis Date Noted  . Elevated blood pressure 02/03/2016  . Hyperlipidemia 05/31/2012    Past Surgical History:  Procedure Laterality Date  . COLON SURGERY     2012, had 1 polyp, Dr Benson [place]        Home Medications    Prior to Admission medications   Medication Sig Start Date End Date Taking? Authorizing Provider  amLODipine (NORVASC) 5 MG tablet Take 1 tablet (5 mg total) by mouth daily. Patient taking differently: Take 5 mg by mouth every evening.  05/05/17  Yes Tereasa Coop, PA-C  aspirin 81 MG tablet Take 81 mg by mouth daily.   Yes [provider]  simvastatin (ZOCOR) 20 MG tablet TAKE ONE TABLET BY MOUTH IN THE EVENING. 05/05/17  Yes Tereasa Coop, PA-C    Family History Family History  Problem Relation Age of Onset  . Hypertension Sister     Social History Social History   Tobacco Use  . Smoking status: Former Smoker    Packs/day: 1.00    Years: 40.00    Pack years: 40.00    Types: Cigarettes    Last attempt to quit:  05/20/1994    Years since quitting: 23.4  . Smokeless tobacco: Never Used  Substance Use Topics  . Alcohol use: No    Alcohol/week: 0.0 oz  . Drug use: No     Allergies   Patient has no known allergies.   Review of Systems Review of Systems  Cardiovascular: Positive for chest pain.  Musculoskeletal: Positive for back pain.  All other systems reviewed and are negative.    Physical Exam Updated Vital Signs BP 122/83 (BP Location: Right Arm)   Pulse 72   Temp 98.1 F (36.7 C) (Oral)   Resp 15   Ht 5\' 3"  (1.6 m)   Wt 81.6 kg (180 lb)   SpO2 98%   BMI 31.89 kg/m   Physical Exam  Constitutional: He is oriented to person, place, and time. He appears well-developed and well-nourished. No distress.  HENT:  Head: Normocephalic and atraumatic.  Right Ear: Hearing normal.  Left Ear: Hearing normal.  Nose: Nose normal.  Mouth/Throat: Oropharynx is clear and moist and mucous membranes are normal.  Eyes: Pupils are equal, round, and reactive to light. Conjunctivae and EOM are normal.  Neck: Normal range of motion. Neck supple.  Cardiovascular: Regular rhythm, S1 normal and S2 normal.  Exam reveals no gallop and no friction rub.  No murmur heard. Pulmonary/Chest: Effort normal and breath sounds normal. No respiratory distress. He exhibits no tenderness.  Abdominal: Soft. Normal appearance and bowel sounds are normal. There is no hepatosplenomegaly. There is no tenderness. There is no rebound, no guarding, no tenderness at McBurney's point and negative Murphy's sign. No hernia.  Musculoskeletal: Normal range of motion.  No back tenderness but does have increased pain when he leans forward or twists to the left  Neurological: He is alert and oriented to person, place, and time. He has normal strength. No cranial nerve deficit or sensory deficit. Coordination normal. GCS eye subscore is 4. GCS verbal subscore is 5. GCS motor subscore is 6.  Skin: Skin is warm, dry and intact. No rash  noted. No cyanosis.  Psychiatric: He has a normal mood and affect. His speech is normal and behavior is normal. Thought content normal.  Nursing note and vitals reviewed.    ED Treatments / Results  Labs (all labs ordered are listed, but only abnormal results are displayed) Labs Reviewed  BASIC METABOLIC PANEL - Abnormal; Notable for the following components:      Result Value   Glucose, Bld 168 (*)    Calcium 8.5 (*)    All other components within normal limits  CBC - Abnormal; Notable for the following components:   HCT 37.6 (*)    All other components within normal limits  I-STAT TROPONIN, ED  I-STAT TROPONIN, ED    EKG EKG Interpretation  Date/Time:  Saturday October 18 2017 19:52:31 EDT Ventricular Rate:  69 PR Interval:  196 QRS Duration: 92 QT Interval:  378 QTC Calculation: 405 R Axis:   54 Text Interpretation:  Normal sinus rhythm Nonspecific T wave abnormality Abnormal ECG No old tracing to compare Confirmed by Delora Fuel ([id number]) on 10/18/2017 10:52:48 PM   Radiology Dg Chest 2 View  Result Date: 10/18/2017 CLINICAL DATA:  Left-sided chest pain for 2 days EXAM: CHEST - 2 VIEW COMPARISON:  None. FINDINGS: The heart size and mediastinal contours are within normal limits. Both lungs are clear. The visualized skeletal structures are unremarkable. IMPRESSION: No active cardiopulmonary disease. Electronically Signed   By: Inez Catalina M.D.   On: 10/18/2017 20:31   Ct Angio Chest Pe W Or Wo Contrast  Result Date: 10/19/2017 CLINICAL DATA:  Acute onset of generalized chest pain. EXAM: CT ANGIOGRAPHY CHEST WITH CONTRAST TECHNIQUE: Multidetector CT imaging of the chest was performed using the standard protocol during bolus administration of intravenous contrast. Multiplanar CT image reconstructions and MIPs were obtained to evaluate the vascular anatomy. CONTRAST:  174mL ISOVUE-370 IOPAMIDOL (ISOVUE-370) INJECTION 76% COMPARISON:  Chest radiograph performed 10/18/2017 FINDINGS:  Cardiovascular:  There is no evidence of pulmonary embolus. The heart is normal in size. The thoracic aorta is unremarkable. The great vessels are within normal limits. Mediastinum/Nodes: The mediastinum is unremarkable in appearance. No mediastinal lymphadenopathy is seen. No pericardial effusion is identified. The thyroid gland is unremarkable. No axial lymphadenopathy is appreciated. Lungs/Pleura: The lungs are clear bilaterally. No focal consolidation, pleural effusion or pneumothorax is seen. No masses are identified. Scattered blebs are noted at the lung apices. Upper Abdomen: The visualized portions of the liver and spleen are unremarkable. The visualized portions of the gallbladder and pancreas are within normal limits. Musculoskeletal: No acute osseous abnormalities are identified. Anterior bridging osteophytes are noted along the lower cervical spine. The visualized musculature is unremarkable in appearance. Review of the MIP images  confirms the above findings. IMPRESSION: 1. No evidence of pulmonary embolus. 2. Scattered blebs at the lung apices.  Lungs otherwise clear. Electronically Signed   By: Garald Balding M.D.   On: 10/19/2017 04:32    Procedures Procedures (including critical care time)  Medications Ordered in ED Medications  iopamidol (ISOVUE-370) 76 % injection 100 mL (has no administration in time range)  iopamidol (ISOVUE-370) 76 % injection 100 mL (100 mLs Intravenous Contrast Given 10/19/17 0359)     Initial Impression / Assessment and Plan / ED Course  I have reviewed the triage vital signs and the nursing notes.  Pertinent labs & imaging results that were available during my care of the patient were reviewed by me and considered in my medical decision making (see chart for details).     Presents to the emergency department for evaluation of chest pain.  Patient has had more than 24 hours of atypical pain in the left chest and back area.  Pain is somewhat related to  movement and he thinks he might of hurt himself unloading a truck the other day, but there was no clear injury.  He does have some reproducibility here in the ER.  He has not been experiencing exertional chest pain, no history of coronary artery disease.  Pain has been present for more than 24 hours, EKG does not show ischemia or infarct and he has had 2- troponins.  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  CT angiography did not show any aortic abnormality and no evidence of PE.  Patient reassured, will treat with analgesia, rest and follow-up with PCP.  Return if symptoms worsen.  Final Clinical Impressions(s) / ED Diagnoses   Final diagnoses:  Atypical chest pain    ED Discharge Orders    None       Orpah Greek, MD 10/19/17 [phone]

## 2017-10-19 NOTE — ED Notes (Signed)
Pt unable to s-sign d/t equipment malfunction.  Pt verbalized understanding of d/c instructions, follow up and return precautions.

## 2017-12-20 ENCOUNTER — Other Ambulatory Visit: Payer: Self-pay

## 2017-12-20 ENCOUNTER — Observation Stay (HOSPITAL_COMMUNITY)
Admission: EM | Admit: 2017-12-20 | Discharge: 2017-12-21 | Disposition: A | Discharge status: Home / Self Care | Payer: Medicare Other | Attending: Family Medicine | Admitting: Family Medicine

## 2017-12-20 ENCOUNTER — Emergency Department (HOSPITAL_COMMUNITY): Payer: Medicare Other

## 2017-12-20 ENCOUNTER — Ambulatory Visit (INDEPENDENT_AMBULATORY_CARE_PROVIDER_SITE_OTHER): Payer: Medicare Other | Admitting: Urgent Care

## 2017-12-20 ENCOUNTER — Encounter (HOSPITAL_COMMUNITY): Payer: Self-pay | Admitting: Emergency Medicine

## 2017-12-20 ENCOUNTER — Encounter: Payer: Self-pay | Admitting: Family Medicine

## 2017-12-20 VITALS — BP 134/77 | HR 70 | Temp 98.1°F | Resp 16 | Ht 63.0 in | Wt 179.0 lb

## 2017-12-20 DIAGNOSIS — E785 Hyperlipidemia, unspecified: Secondary | ICD-10-CM

## 2017-12-20 DIAGNOSIS — E119 Type 2 diabetes mellitus without complications: Secondary | ICD-10-CM | POA: Diagnosis not present

## 2017-12-20 DIAGNOSIS — Z7982 Long term (current) use of aspirin: Secondary | ICD-10-CM | POA: Diagnosis not present

## 2017-12-20 DIAGNOSIS — G458 Other transient cerebral ischemic attacks and related syndromes: Secondary | ICD-10-CM

## 2017-12-20 DIAGNOSIS — J45909 Unspecified asthma, uncomplicated: Secondary | ICD-10-CM | POA: Diagnosis not present

## 2017-12-20 DIAGNOSIS — Z87891 Personal history of nicotine dependence: Secondary | ICD-10-CM | POA: Diagnosis not present

## 2017-12-20 DIAGNOSIS — R208 Other disturbances of skin sensation: Secondary | ICD-10-CM | POA: Diagnosis not present

## 2017-12-20 DIAGNOSIS — R2990 Unspecified symptoms and signs involving the nervous system: Secondary | ICD-10-CM

## 2017-12-20 DIAGNOSIS — R200 Anesthesia of skin: Secondary | ICD-10-CM

## 2017-12-20 DIAGNOSIS — E7849 Other hyperlipidemia: Secondary | ICD-10-CM

## 2017-12-20 DIAGNOSIS — R29898 Other symptoms and signs involving the musculoskeletal system: Secondary | ICD-10-CM | POA: Diagnosis not present

## 2017-12-20 DIAGNOSIS — I639 Cerebral infarction, unspecified: Secondary | ICD-10-CM | POA: Diagnosis not present

## 2017-12-20 DIAGNOSIS — Z87448 Personal history of other diseases of urinary system: Secondary | ICD-10-CM | POA: Insufficient documentation

## 2017-12-20 DIAGNOSIS — I6339 Cerebral infarction due to thrombosis of other cerebral artery: Secondary | ICD-10-CM

## 2017-12-20 DIAGNOSIS — R29818 Other symptoms and signs involving the nervous system: Secondary | ICD-10-CM | POA: Diagnosis not present

## 2017-12-20 DIAGNOSIS — Z79899 Other long term (current) drug therapy: Secondary | ICD-10-CM | POA: Diagnosis not present

## 2017-12-20 DIAGNOSIS — R4781 Slurred speech: Secondary | ICD-10-CM | POA: Diagnosis not present

## 2017-12-20 DIAGNOSIS — I10 Essential (primary) hypertension: Secondary | ICD-10-CM | POA: Diagnosis not present

## 2017-12-20 DIAGNOSIS — Z8639 Personal history of other endocrine, nutritional and metabolic disease: Secondary | ICD-10-CM | POA: Diagnosis not present

## 2017-12-20 DIAGNOSIS — R202 Paresthesia of skin: Secondary | ICD-10-CM

## 2017-12-20 DIAGNOSIS — I6381 Other cerebral infarction due to occlusion or stenosis of small artery: Secondary | ICD-10-CM

## 2017-12-20 LAB — CBG MONITORING, ED
Glucose-Capillary: 88 mg/dL (ref 70–99)
Glucose-Capillary: 86 mg/dL (ref 70–99)

## 2017-12-20 LAB — COMPREHENSIVE METABOLIC PANEL
ALT: 22 U/L (ref 0–44)
AST: 24 U/L (ref 15–41)
Albumin: 4 g/dL (ref 3.5–5.0)
Alkaline Phosphatase: 71 U/L (ref 38–126)
Anion gap: 8 (ref 5–15)
BUN: 12 mg/dL (ref 8–23)
CO2: 26 mmol/L (ref 22–32)
Calcium: 9.2 mg/dL (ref 8.9–10.3)
Chloride: 104 mmol/L (ref 98–111)
Creatinine, Ser: 1.08 mg/dL (ref 0.61–1.24)
GFR calc Af Amer: >60 mL/min (ref >60)
GFR calc non Af Amer: >60 mL/min (ref >60)
Glucose, Bld: 96 mg/dL (ref 70–99)
Potassium: 4.2 mmol/L (ref 3.5–5.1)
Sodium: 138 mmol/L (ref 135–145)
Total Bilirubin: 0.7 mg/dL (ref 0.3–1.2)
Total Protein: 6.8 g/dL (ref 6.5–8.1)

## 2017-12-20 LAB — DIFFERENTIAL
Abs Immature Granulocytes: 0 10*3/uL (ref 0.0–0.1)
Basophils Absolute: 0.1 10*3/uL (ref 0.0–0.1)
Basophils Relative: 1 %
Eosinophils Absolute: 0.1 10*3/uL (ref 0.0–0.7)
Eosinophils Relative: 2 %
Immature Granulocytes: 0 %
Lymphocytes Relative: 33 %
Lymphs Abs: 2.6 10*3/uL (ref 0.7–4.0)
Monocytes Absolute: 0.9 10*3/uL (ref 0.1–1.0)
Monocytes Relative: 11 %
Neutro Abs: 4 10*3/uL (ref 1.7–7.7)
Neutrophils Relative %: 53 %

## 2017-12-20 LAB — I-STAT TROPONIN, ED: Troponin i, poc: 0 ng/mL (ref 0.00–0.08)

## 2017-12-20 LAB — PROTIME-INR
INR: 1.05
Prothrombin Time: 13.6 seconds (ref 11.4–15.2)

## 2017-12-20 LAB — CBC
HCT: 40.5 % (ref 39.0–52.0)
Hemoglobin: 14.1 g/dL (ref 13.0–17.0)
MCH: 29 pg (ref 26.0–34.0)
MCHC: 34.8 g/dL (ref 30.0–36.0)
MCV: 83.3 fL (ref 78.0–100.0)
Platelets: 262 10*3/uL (ref 150–400)
RBC: 4.86 MIL/uL (ref 4.22–5.81)
RDW: 12.5 % (ref 11.5–15.5)
WBC: 7.7 10*3/uL (ref 4.0–10.5)

## 2017-12-20 LAB — APTT: aPTT: 28 seconds (ref 24–36)

## 2017-12-20 MED ORDER — ACETAMINOPHEN 160 MG/5ML PO SOLN: 650.0000 mg | ORAL | Status: DC | PRN

## 2017-12-20 MED ORDER — ASPIRIN 325 MG PO TABS
325.0000 mg | ORAL_TABLET | Freq: Every day | ORAL | Status: DC
  Administered 2017-12-21 (×2): 325 mg via ORAL
  Filled 2017-12-20 (×2): qty 1

## 2017-12-20 MED ORDER — AMLODIPINE BESYLATE 5 MG PO TABS
5.0000 mg | ORAL_TABLET | Freq: Every evening | ORAL | Status: DC
  Administered 2017-12-20 – 2017-12-21 (×2): 5 mg via ORAL
  Filled 2017-12-20 (×2): qty 1

## 2017-12-20 MED ORDER — ACETAMINOPHEN 650 MG RE SUPP: 650.0000 mg | RECTAL | Status: DC | PRN

## 2017-12-20 MED ORDER — SIMVASTATIN 20 MG PO TABS: 20.0000 mg | ORAL_TABLET | Freq: Every day | ORAL | Status: DC

## 2017-12-20 MED ORDER — HEPARIN SODIUM (PORCINE) 5000 UNIT/ML IJ SOLN
5000.0000 [IU] | Freq: Three times a day (TID) | INTRAMUSCULAR | Status: DC
  Administered 2017-12-21 (×3): 5000 [IU] via SUBCUTANEOUS
  Filled 2017-12-20 (×3): qty 1

## 2017-12-20 MED ORDER — ACETAMINOPHEN 325 MG PO TABS: 650.0000 mg | ORAL_TABLET | ORAL | Status: DC | PRN

## 2017-12-20 MED ORDER — STROKE: EARLY STAGES OF RECOVERY BOOK
Freq: Once | Status: AC
  Administered 2017-12-20: 19:00:00

## 2017-12-20 NOTE — ED Notes (Signed)
Numbness to lips at 0700 this am; also felt "heat sensation" to left side head, face and hand; did not resolve; subsequently went to Urgent Care for same - seen at [facility] and then sent to ED for further eval of same - states symptoms have since resolved - just feels "weak"; presently caox4; appropriate in conversation - ccm showing SR rate 70 without ectopy

## 2017-12-20 NOTE — ED Notes (Signed)
Medical team in to assess

## 2017-12-20 NOTE — ED Notes (Signed)
Attempted to call report - states charge nurse has not approved bed assignment

## 2017-12-20 NOTE — ED Notes (Signed)
attempted to call report for second time - secretary states bed now approved; however, charge RN unavailable at this time as she is in with pt; informed Network engineer I will transport pt and give bedside report

## 2017-12-20 NOTE — ED Notes (Signed)
ED charge nurse aware of hold up with admission; states to attempt to call report again - if floor RN will not take report then transport pt and give bedside report - [facility] aware of same

## 2017-12-20 NOTE — Progress Notes (Signed)
Family Medicine Teaching Service Daily Progress Note Intern Pager: [phone]  Patient name: Johnny Sheppard Medical record number: [id number] Date of birth: 08-19-1950 Age: 67 y.o. Gender: male  Primary Care Provider: Forrest Moron, MD Consultants: Neurology Code Status: Full  Pt Overview and Major Events to Date:  12/20/17 admitted with acute stroke  Assessment and Plan: [name] is a 67 y.o. male who presented with L facial, lip, and arm numbness and paraesthesias that was subsequently found to have a right lateral thalamic infarct. PMH is significant for hypertension, hyperlipidemia, diet controlled DM,h/o tobacco use, asthma, and arthritis.   Acute R Ischemic Thalamic Stroke: Presented with L sided numbness and parasthesias and was found to have acute R thalamic infarct on MRI brain. - Neurology following, appreciate recommendations - ASA325mg  daily - MRA head and neck  - Echocardiogram  - Risk stratification labs: a1c and LDL pending - BP goal normotensive - continue home simvastatin as patient was unable to tolerate atorvastatin due to myalgias, could consider rosuvastatin as may be better tolerated - PT/OT consulted  Hypertension: Stable and normotensive -Continue home amlodipine 5mg   -Monitor BP  Hyperlipidemia: Was unable to tolerate atorvastatin d/t myalgias in the past.  -Continue home Simvastatin 20 mg  -lipid panel pending  Type 2 Diabetes, diet controlled: a1c 6.1 -Monitor glucose on BMP   FEN/GI: Heart healthy diet Prophylaxis: Heparin   Disposition: pending medical work up  Subjective:  Feel well this morning. States still has a little numbness at the left side of his lips and in his L fingers. No other complaints.   Objective: Temp:  [97.8 F (36.6 C)-98.4 F (36.9 C)] 98 F (36.7 C) (08/04 0500) Pulse Rate:  [64-72] 72 (08/04 0500) Resp:  [16-21] 18 (08/04 0500) BP: (127-156)/(74-105) 135/97 (08/04 0500) SpO2:  [95 %-98 %] 95 % (08/04  0500) Weight:  [172 lb 13.5 oz (78.4 kg)-179 lb (81.2 kg)] 172 lb 13.5 oz (78.4 kg) (08/03 1807) Physical Exam: General: laying in bed comfortably, in NAD Cardiovascular: RRR, no murmurs  Respiratory: CTAB, normal effort on RA  Abdomen: soft, nontender, nondistended, + bowel sounds  Extremities: WWP, no LE edema Neuro: awake, alert. Slight sensation decrease in L face and L UE. Otherwise grossly normal. Strength 5/5.   Laboratory: Recent Labs  Lab 12/20/17 1205  WBC 7.7  HGB 14.1  HCT 40.5  PLT 262   Recent Labs  Lab 12/20/17 1205  NA 138  K 4.2  CL 104  CO2 26  BUN 12  CREATININE 1.08  CALCIUM 9.2  PROT 6.8  BILITOT 0.7  ALKPHOS 71  ALT 22  AST 24  GLUCOSE 96   Lipid Panel     Component Value Date/Time   CHOL 154 05/05/2017 1812   TRIG 322 (H) 05/05/2017 1812   HDL 39 (L) 05/05/2017 1812   CHOLHDL 3.9 05/05/2017 1812   CHOLHDL 3.4 01/30/2016 0846   VLDL 16 01/30/2016 0846   LDLCALC 51 05/05/2017 1812      Imaging/Diagnostic Tests: Ct Head Wo Contrast  Result Date: 12/20/2017 CLINICAL DATA:  Left facial and arm numbness and slurred speech for several hours. Focal neuro deficit, < 6 hrs, stroke suspected EXAM: CT HEAD WITHOUT CONTRAST TECHNIQUE: Contiguous axial images were obtained from the base of the skull through the vertex without intravenous contrast. COMPARISON:  None. FINDINGS: Brain: No evidence of acute infarction, hemorrhage, hydrocephalus, extra-axial collection, or mass lesion/mass effect. Mild cerebral atrophy and chronic small vessel disease. Vascular:  No hyperdense vessel or other acute findings. Skull: No evidence of fracture or other significant bone abnormality. Sinuses/Orbits:  No acute findings. Other: None. IMPRESSION: No acute intracranial abnormality. Mild cerebral atrophy and chronic small vessel disease. Electronically Signed   By: Earle Gell M.D.   On: 12/20/2017 13:27   Mr Brain Wo Contrast  Result Date: 12/20/2017 CLINICAL DATA:   Acute onset of left facial numbness and tingling beginning today. Left hand numbness. Left leg weakness. Negative head CT. EXAM: MRI HEAD WITHOUT CONTRAST TECHNIQUE: Multiplanar, multiecho pulse sequences of the brain and surrounding structures were obtained without intravenous contrast. COMPARISON:  CT same day FINDINGS: Brain: Diffusion imaging shows acute infarction in the lateral right thalamus, under a cm in size. No other acute infarction. No swelling or hemorrhage. Elsewhere, the brainstem and cerebellum are normal. Cerebral hemispheres show moderate chronic small-vessel ischemic changes of the deep and subcortical white matter. No large vessel territory infarction. No mass lesion, hemorrhage, hydrocephalus or extra-axial collection. Vascular: Major vessels at the base of the brain show flow. Skull and upper cervical spine: Negative Sinuses/Orbits: Clear/normal Other: None IMPRESSION: Acute subcentimeter infarction in the lateral right thalamus. Moderate chronic small-vessel ischemic changes of the cerebral hemispheric white matter. Electronically Signed   By: Nelson Chimes M.D.   On: 12/20/2017 16:21    Bufford Lope, DO 12/21/2017, 5:46 AM PGY-3, [place] Intern pager: [phone], text pages welcome

## 2017-12-20 NOTE — H&P (Addendum)
[facility] Admission History and Physical Service Pager: [phone]  Patient name: Johnny Sheppard Medical record number: [id number] Date of birth: 03-12-1951 Age: 67 y.o. Gender: male  Primary Care Provider: Forrest Moron, MD Consultants: Neurology  Code Status: Full   Chief Complaint: Left facial, lip, and hand numbness/tingling   Assessment and Plan: Johnny Sheppard is a 67 y.o. male presenting with left facial, lip, and arm numbness and paraesthesias that was subsequently found to have a right lateral thalamic infarct. PMH is significant for hypertension, hyperlipidemia, previous 40 pack year smoker, asthma, and arthritis.   Acute R Ischemic Thalamic Stroke: Pt endorses sudden onset of left sided facial, lip, and arm numbness starting this morning. Endorses 90% improvement in symptoms since onset, with residual warmth and tingling mainly in his lip and left hand. On presentation, he is hemodynamically stable and in no acute distress. Slightly hypertensive to max of 156/93. Physical exam notable for increased warmth sensation noted on his left face, hand, and upper leg with mild dysmetria in finger-to-nose testing on the left. Symmetrical smile and full strength, no facial droop or speech difficulties. CT noted no acute intracranial abnormalities, however MRI revealed an acute subcentimeter infarction in the lateral right thalamus. CBC, CMP, glucose, and PT/PTT/INR wnl. Troponin negative. EKG NSR. Passed swallow study in the ED. Likely result of probable uncontrolled hypertension, patient counseled by Neurology for blood pressure monitoring at home. Additional risk factors include DM2, although diet controlled with A1c 5.8 04/2017, HTN, HLD on moderate intensity statin, and previous tobacco history. Will hold on PT/OT/speech given patient's clinical presentation. Can consider sleep study outpatient to r/o OSA given wife endorsing pt snoring while sleeping and fatigue  in the am.  -Admit to telemetry; attending Dr. Erin Hearing  -Neurology consulted; appreciate recs -Echocardiogram -U/s carotids -MRA Head w/o contrast   -A1c and lipid panel pending  -Normotensive BP control: start home amlodipine 5 mg  -Home simvastatin 20 mg (pt notes previous myalgias on atorvastatin)   -Aspirin 325mg    Hypertension: Most recent BP 156/93, 134/77 on arrival. Takes amlodipine 5mg  at home nightly, endorses strict compliance. However, does not check his blood pressure at home or follow up regularly for management.  -Continue home amlodipine 5mg   -Monitor BP and adjust as necessary; goal normotensive per neurology   Hyperlipidemia: LDL 51, HDL 39 in 04/2017. Takes simvastatin 20mg  at home nightly, endorses compliance. He previously tried atorvastatin, however noted "bone pains" and was switched to simvastatin without concerns.  -Continue Simvastatin 20 mg -Lipid panel pending; titrate simvastatin as needed   Type 2 Diabetes, controlled: Diet controlled. Previously noted to have an A1c of 6.6 in 06/2016, but most recent A1c 5.8 in 04/2017. He was prescribed metformin back in 06/2016, however never filled the medication. Glucose ranging 80-90's while in the ED.  -Monitor glucose  -A1c pending   Arthritis: Pt has been told he has arthritis in his spine. He is not endorsing any pain currently. Lumbar spine XR on 04/2013 showing mild to moderate multilevel lumbar spine DDD, worst at L4-L5.  - monitor  History of Asthma: Pt states he had asthma as a child and has not had any breathing difficulty since then. Does not have any inhaler or any asthmatic medication at home. States he "took a pill" when he was wheezing as a kid.   FEN/GI: Heart healthy diet, passed swallow study in the ED. Saline locked IV.  Prophylaxis: Heparin   Disposition: Stable  History of  Present Illness:   Johnny Sheppard is a 67 y.o. male with a past history significant for hypertension, hyperlipidemia, and  ~40 pack year previous smoker that presented following acute onset of left sided facial, lip, and hand numbness and parathesias this morning. Around 730 this morning, he noticed sudden onset of lip and hand numbness with tingling while rinsing out his mouth. He initially thought aspercreme may have accidentally got onto his hand causing his symptoms. However his symptoms continued to persist, but was able to get dressed and help his neighbor with sorting wood. He also had a banana and oatmeal for breakfast during this time, which didn't have any taste. Due to concern that symptoms continued to persist (without worsening), he went to urgent care for evaluation around 11:30am. He states he was neurologically clear at the office, but sent to the ED for further head imaging. He denies any vision changes, facial drooping, difficulty swallowing, SOB, chest pain, bowel/bladder incontinence, nausea, or vomiting. He denies any muscle weakness or difficulty walking, but does note his left leg wanted to "give out on him" earlier.   Currently, the patient notes a 90% improvement in his numbness and tingling, with mostly "pins and needles" remaining in his lips and hand sometimes radiating into his forearm. He only feels a difference in his face sensations when he touches it. He does also note that his left hand and face will feel more warm than usual, like "heat has sprung on him".   On arrival to the ED, he was hemodynamically stable and in no acute distress. A CT head was obtained, showing no acute intracranial abnormality with chronic small vessel disease. However, given his concerning symptoms, an MRI was obtained for further evaluation. MRI revealed an acute subcentimeter infarction in the lateral right thalamus. CBC, CMP, PT/PTT/INR, and glucose wnl. Troponin negative. EKG NSR without ischemic changes. While in the ED, he was able to eat crackers without difficulty.   Review Of Systems: Per HPI   Patient Active  Problem List   Diagnosis Date Noted  . Elevated blood pressure 02/03/2016  . Hyperlipidemia 05/31/2012    Past Medical History: Past Medical History:  Diagnosis Date  . Arthritis   . Asthma   . H/O hematuria    Followed by urology  . Hyperlipidemia   . Hypertension     Past Surgical History: Past Surgical History:  Procedure Laterality Date  . COLON SURGERY     2012, had 1 polyp, Dr Benson [place]    Social History: Social History   Tobacco Use  . Smoking status: Former Smoker    Packs/day: 1.00    Years: 40.00    Pack years: 40.00    Types: Cigarettes    Last attempt to quit: 05/20/1994    Years since quitting: 23.6  . Smokeless tobacco: Never Used  Substance Use Topics  . Alcohol use: No    Alcohol/week: 0.0 oz  . Drug use: No   Additional social history: Previous alcohol use for 10-15 years, quit in 1999. Endorsed taking in ~1 pint/day of beer/wine/liquor on the weekends intermittently. No previous seizures or hospitalizations for alcoholism. Part time truck driver. Married, has 6 children.   Please also refer to relevant sections of EMR.  Family History: Family History  Problem Relation Age of Onset  . Hypertension Sister    Pt endorses FH of heart disease, mother had stroke ~65-70, no known FH of blood d/o.  Allergies and Medications: No Known Allergies No current facility-administered  medications on file prior to encounter.    Current Outpatient Medications on File Prior to Encounter  Medication Sig Dispense Refill  . acetaminophen (TYLENOL) 500 MG tablet Take 500-1,000 mg by mouth as needed for mild pain.    Marland Kitchen amLODipine (NORVASC) 5 MG tablet Take 1 tablet (5 mg total) by mouth daily. (Patient taking differently: Take 5 mg by mouth every evening. ) 90 tablet 3  . aspirin 81 MG tablet Take 81 mg by mouth daily.    . Multiple Vitamins-Minerals (AIRBORNE PO) Take 1 tablet by mouth daily.    . simvastatin (ZOCOR) 20 MG tablet TAKE ONE TABLET BY MOUTH IN THE  EVENING. 90 tablet 3    Objective: BP (!) 152/74 (BP Location: Right Arm)   Pulse 70   Temp 98.4 F (36.9 C) (Oral)   Resp 18   Ht 5\' 3"  (1.6 m)   Wt 178 lb (80.7 kg)   SpO2 98%   BMI 31.53 kg/m  Exam: General: Alert 67 yo male in no acute distress, cheerful and smiling  HEENT: NCAT, MMM, oropharynx nonerythematous  Cardiac: RRR no m/g/r  Lungs: Clear bilaterally, no increased WOB  Abdomen: soft, non-tender, non-distended, normoactive BS Neuro: Alert and oriented x3. Smile symmetrical, no facial droop. Speech intact and appropriate. CN 2-12 intact. Pupils equal and reactive. EOM and peripheral vision intact. U/L extremity muscle strength 5/5 bilaterally. Sensation to temperature altered, noted to be warmer on his left face, hand, and upper leg. Sensation to light touch intact. Slight dysmetria with finger to nose on left, intact on right. Heel to shin intact bilaterally.  Skin: No rashes noted.  Ext: Warm, dry, 2+ distal pulses, no edema  Psych: appropriate affect and mood.   Labs and Imaging: CBC BMET  Recent Labs  Lab 12/20/17 1205  WBC 7.7  HGB 14.1  HCT 40.5  PLT 262   Recent Labs  Lab 12/20/17 1205  NA 138  K 4.2  CL 104  CO2 26  BUN 12  CREATININE 1.08  GLUCOSE 96  CALCIUM 9.2     Ct Head Wo Contrast  Result Date: 12/20/2017 CLINICAL DATA:  Left facial and arm numbness and slurred speech for several hours. Focal neuro deficit, < 6 hrs, stroke suspected EXAM: CT HEAD WITHOUT CONTRAST TECHNIQUE: Contiguous axial images were obtained from the base of the skull through the vertex without intravenous contrast. COMPARISON:  None. FINDINGS: Brain: No evidence of acute infarction, hemorrhage, hydrocephalus, extra-axial collection, or mass lesion/mass effect. Mild cerebral atrophy and chronic small vessel disease. Vascular:  No hyperdense vessel or other acute findings. Skull: No evidence of fracture or other significant bone abnormality. Sinuses/Orbits:  No acute  findings. Other: None. IMPRESSION: No acute intracranial abnormality. Mild cerebral atrophy and chronic small vessel disease. Electronically Signed   By: Earle Gell M.D.   On: 12/20/2017 13:27   Mr Brain Wo Contrast  Result Date: 12/20/2017 CLINICAL DATA:  Acute onset of left facial numbness and tingling beginning today. Left hand numbness. Left leg weakness. Negative head CT. EXAM: MRI HEAD WITHOUT CONTRAST TECHNIQUE: Multiplanar, multiecho pulse sequences of the brain and surrounding structures were obtained without intravenous contrast. COMPARISON:  CT same day FINDINGS: Brain: Diffusion imaging shows acute infarction in the lateral right thalamus, under a cm in size. No other acute infarction. No swelling or hemorrhage. Elsewhere, the brainstem and cerebellum are normal. Cerebral hemispheres show moderate chronic small-vessel ischemic changes of the deep and subcortical white matter. No large vessel territory  infarction. No mass lesion, hemorrhage, hydrocephalus or extra-axial collection. Vascular: Major vessels at the base of the brain show flow. Skull and upper cervical spine: Negative Sinuses/Orbits: Clear/normal Other: None IMPRESSION: Acute subcentimeter infarction in the lateral right thalamus. Moderate chronic small-vessel ischemic changes of the cerebral hemispheric white matter. Electronically Signed   By: Nelson Chimes M.D.   On: 12/20/2017 16:21    Patriciaann Clan, DO 12/20/2017, 4:51 PM PGY-1, [facility] pager: [phone], text pages welcome  FPTS Upper-Level Resident Addendum  I have independently interviewed and examined the patient. I have discussed the above with the original author and agree with their documentation. My edits for correction/addition/clarification are in green. Please see also any attending notes.   Rory Percy, DO PGY-2, [facility] 12/20/2017 8:47 PM  FPTS Service pager: [phone] (text pages welcome through  [facility])

## 2017-12-20 NOTE — ED Provider Notes (Signed)
[facility] EMERGENCY DEPARTMENT Provider Note   CSN: [id number] Arrival date & time: 12/20/17  1201     History   Chief Complaint Chief Complaint  Patient presents with  . Stroke Symptoms    HPI [name] Simoneaux is a 67 y.o. male.  Patient with history of high blood pressure and high cholesterol, no previous strokes presents to the emergency department with complaint of pins and needle sensation and numbness to the left face, tongue, and left hand starting at approximately 7 AM today.  Patient reports feeling "heat sensation" to the left side of his head and in these areas.  Patient went to urgent care where he was referred to the emergency department with concern over stroke or TIA.  Symptoms have gradually resolved.  He did not have any weakness.  Patient denies other signs of stroke including: facial droop, slurred speech, aphasia, weakness/numbness in extremities, imbalance/trouble walking.      Past Medical History:  Diagnosis Date  . Arthritis   . Asthma   . H/O hematuria    Followed by urology  . Hyperlipidemia   . Hypertension     Patient Active Problem List   Diagnosis Date Noted  . Elevated blood pressure 02/03/2016  . Hyperlipidemia 05/31/2012    Past Surgical History:  Procedure Laterality Date  . COLON SURGERY     2012, had 1 polyp, Dr Benson [place]        Home Medications    Prior to Admission medications   Medication Sig Start Date End Date Taking? Authorizing Provider  amLODipine (NORVASC) 5 MG tablet Take 1 tablet (5 mg total) by mouth daily. Patient taking differently: Take 5 mg by mouth every evening.  05/05/17   Tereasa Coop, PA-C  aspirin 81 MG tablet Take 81 mg by mouth daily.    [provider]  simvastatin (ZOCOR) 20 MG tablet TAKE ONE TABLET BY MOUTH IN THE EVENING. 05/05/17   Tereasa Coop, PA-C    Family History Family History  Problem Relation Age of Onset  . Hypertension Sister     Social  History Social History   Tobacco Use  . Smoking status: Former Smoker    Packs/day: 1.00    Years: 40.00    Pack years: 40.00    Types: Cigarettes    Last attempt to quit: 05/20/1994    Years since quitting: 23.6  . Smokeless tobacco: Never Used  Substance Use Topics  . Alcohol use: No    Alcohol/week: 0.0 oz  . Drug use: No     Allergies   Patient has no known allergies.   Review of Systems Review of Systems  Constitutional: Positive for fatigue. Negative for fever.  HENT: Negative for rhinorrhea and sore throat.   Eyes: Negative for redness.  Respiratory: Negative for cough.   Cardiovascular: Negative for chest pain.  Gastrointestinal: Negative for abdominal pain, diarrhea, nausea and vomiting.  Genitourinary: Negative for dysuria.  Musculoskeletal: Negative for myalgias.  Skin: Negative for rash.  Neurological: Positive for numbness (paresthesias). Negative for facial asymmetry, speech difficulty, weakness and headaches.     Physical Exam Updated Vital Signs BP (!) 152/74 (BP Location: Right Arm)   Pulse 70   Temp 98.4 F (36.9 C) (Oral)   Resp 18   Ht 5\' 3"  (1.6 m)   Wt 80.7 kg (178 lb)   SpO2 98%   BMI 31.53 kg/m   Physical Exam  Constitutional: He is oriented to person, place, and  time. He appears well-developed and well-nourished.  HENT:  Head: Normocephalic and atraumatic.  Right Ear: Tympanic membrane, external ear and ear canal normal.  Left Ear: Tympanic membrane, external ear and ear canal normal.  Nose: Nose normal.  Mouth/Throat: Uvula is midline, oropharynx is clear and moist and mucous membranes are normal.  Eyes: Pupils are equal, round, and reactive to light. Conjunctivae, EOM and lids are normal.  Neck: Normal range of motion. Neck supple.  Cardiovascular: Normal rate and regular rhythm.  Pulmonary/Chest: Effort normal and breath sounds normal.  Abdominal: Soft. There is no tenderness.  Musculoskeletal: Normal range of motion.        Cervical back: He exhibits normal range of motion, no tenderness and no bony tenderness.  Neurological: He is alert and oriented to person, place, and time. He has normal strength and normal reflexes. No cranial nerve deficit or sensory deficit. He exhibits normal muscle tone. He displays a negative Romberg sign. Coordination and gait normal. GCS eye subscore is 4. GCS verbal subscore is 5. GCS motor subscore is 6.  Skin: Skin is warm and dry.  Psychiatric: He has a normal mood and affect.  Nursing note and vitals reviewed.    ED Treatments / Results  Labs (all labs ordered are listed, but only abnormal results are displayed) Labs Reviewed  PROTIME-INR  APTT  CBC  DIFFERENTIAL  COMPREHENSIVE METABOLIC PANEL  CBG MONITORING, ED  I-STAT TROPONIN, ED  CBG MONITORING, ED    ED ECG REPORT   Date: 12/20/2017  Rate: 68  Rhythm: normal sinus rhythm  QRS Axis: normal  Intervals: normal  ST/T Wave abnormalities: normal  Conduction Disutrbances:first-degree A-V block   Narrative Interpretation:   Old EKG Reviewed: unchanged from 10/18/17  I have personally reviewed the EKG tracing and agree with the computerized printout as noted.  Radiology Ct Head Wo Contrast  Result Date: 12/20/2017 CLINICAL DATA:  Left facial and arm numbness and slurred speech for several hours. Focal neuro deficit, < 6 hrs, stroke suspected EXAM: CT HEAD WITHOUT CONTRAST TECHNIQUE: Contiguous axial images were obtained from the base of the skull through the vertex without intravenous contrast. COMPARISON:  None. FINDINGS: Brain: No evidence of acute infarction, hemorrhage, hydrocephalus, extra-axial collection, or mass lesion/mass effect. Mild cerebral atrophy and chronic small vessel disease. Vascular:  No hyperdense vessel or other acute findings. Skull: No evidence of fracture or other significant bone abnormality. Sinuses/Orbits:  No acute findings. Other: None. IMPRESSION: No acute intracranial abnormality. Mild  cerebral atrophy and chronic small vessel disease. Electronically Signed   By: Earle Gell M.D.   On: 12/20/2017 13:27    Procedures Procedures (including critical care time)  Medications Ordered in ED Medications - No data to display   Initial Impression / Assessment and Plan / ED Course  I have reviewed the triage vital signs and the nursing notes.  Pertinent labs & imaging results that were available during my care of the patient were reviewed by me and considered in my medical decision making (see chart for details).     Patient seen and examined. Work-up initiated. Medications ordered. No code stroke as symptoms are essentially resolved and patient is out of 4-1/2-hour window.  LVO negative.   Vital signs reviewed and are as follows: BP (!) 152/74 (BP Location: Right Arm)   Pulse 70   Temp 98.4 F (36.9 C) (Oral)   Resp 18   Ht 5\' 3"  (1.6 m)   Wt 80.7 kg (178 lb)  SpO2 98%   BMI 31.53 kg/m   Initial work-up is negative.  Given his symptoms, will ensure no ischemic stroke with MRI.  If negative, patient can likely be discharged home as he is currently asymptomatic with close PCP follow-up.  We discussed signs and symptoms which should cause him to return.  Patient discussed with Dr. Wilson Singer.   4:41 PM MRI demonstrates acute subcentimeter right thalamic infarct. Spoke with Dr. Cheral Marker. Neuro will see.   4:49 PM Spoke with Family Practice who will see patient.   Final Clinical Impressions(s) / ED Diagnoses   Final diagnoses:  Thalamic infarct, acute ([place])   Admit. Acute CVA.   ED Discharge Orders    None       Carlisle Cater, Hershal Coria 12/20/17 1649    Virgel Manifold, MD 12/21/17 [phone]

## 2017-12-20 NOTE — Patient Instructions (Addendum)
Please report to the [facility] ER immediately as I am concerned that you are having strokelike symptoms versus a transient ischemic attack.  I will not send you by ambulance but you must not drive yourself, please allow your wife to drive either.    Transient Ischemic Attack A transient ischemic attack (TIA) is a "warning stroke" that causes stroke-like symptoms that then go away quickly. The symptoms of a TIA come on suddenly, and they last less than 24 hours. Unlike a stroke, a TIA does not cause permanent damage to the brain. It is important to know the symptoms of a TIA and what to do. Seek medical care right away, even if your symptoms go away. Having a TIA is a sign that you are at higher risk for a permanent stroke. Lifestyle changes and medical treatments can help prevent a stroke. What are the causes? This condition is caused by a temporary blockage in an artery in the head or neck. The blockage does not allow the brain to get the blood supply it needs and can cause various symptoms. The blockage can be caused by:  Fatty buildup in an artery in the head or neck (atherosclerosis).  A blood clot.  Tearing of an artery (dissection).  Inflammation of an artery (vasculitis).  Sometimes the cause is not known. What increases the risk? Certain factors may make you more likely to develop this condition. Some of these factors are things that you can change, such as:  Obesity.  Using products that contain nicotine or tobacco, such as cigarettes and e-cigarettes.  Taking oral birth control, especially if you also use tobacco.  Lack of physical inactivity.  Excessive use of alcohol.  Use of drugs, especially cocaine and methamphetamine.  Other risk factors include:  High blood pressure (hypertension).  High cholesterol.  Diabetes mellitus.  Heart disease (coronary artery disease).  Atrial fibrillation.  Being African American or Hispanic.  Being over the age of  87.  Being male.  Family history of stroke.  Previous history of blood clots, stroke, TIA, or heart attack.  Sickle cell disease.  Being a woman with a history of preeclampsia.  Migraine headache.  Sleep apnea.  Chronic inflammatory diseases, such as rheumatoid arthritis or lupus.  Blood clotting disorders (hypercoagulable state).  What are the signs or symptoms? Symptoms of this condition are the same as those of a stroke, but they are temporary. The symptoms develop suddenly, and they go away quickly, usually within minutes to hours. Symptoms may include sudden:  Weakness or numbness in your face, arm, or leg, especially on one side of your body.  Trouble walking or difficulty moving your arms or legs.  Trouble speaking, understanding speech, or both (aphasia).  Vision changes in one or both eyes. These include double vision, blurred vision, or loss of vision.  Dizziness.  Confusion.  Loss of balance or coordination.  Nausea and vomiting.  Severe headache with no known cause.  If possible, make note of the exact time that you last felt like your normal self and what time your symptoms started. Tell your health care provider. How is this diagnosed? This condition may be diagnosed based on:  Your symptoms and medical history.  A physical exam.  Imaging tests, usually a CT or MRI scan of the brain.  Blood tests.  You may also have other tests, including:  Electrocardiogram (ECG).  Echocardiogram.  Carotid ultrasound.  A scan of the brain circulation (CT angiogram or MRI angiogram).  Continuous  heart monitoring.  How is this treated? The goal of treatment is to reduce the risk for a subsequent stroke. Treatment may include stroke prevention therapies such as:  Changes to diet or lifestyle to decrease your risk. Lifestyle changes may include exercising and stopping smoking.  Medicines to thin the blood (antiplatelets or anticoagulants).  Blood  pressure medicines.  Medicines to reduce cholesterol.  Treating other health conditions, such as diabetes or atrial fibrillation.  If testing shows that you have narrowing in the arteries to your brain, your health care provider may recommend a procedure, such as:  Carotid endarterectomy. This is a surgery to remove the blockage from your artery.  Carotid angioplasty and stenting. This is a procedure to open or widen an artery in the neck using a metal mesh tube (stent). The stent helps keep the artery open by supporting the artery walls.  Follow these instructions at home: Medicines  Take over-the-counter and prescription medicines only as told by your health care provider.  If you were told to take a medicine to thin your blood, such as aspirin or an anticoagulant, take it exactly as told by your health care provider. ? Taking too much blood-thinning medicine can cause bleeding. ? If you do not take enough blood-thinning medicine, you will not have the protection that you need against a stroke and other problems. Eating and drinking  Eat 5 or more servings of fruits and vegetables each day.  Follow instructions from your health care provider about diet. You may need to follow a certain nutrition plan to help manage risk factors for stroke, such as high blood pressure, high cholesterol, diabetes, or obesity. This may include: ? Eating a low-fat, low-salt diet. ? Including a lot of fiber in your diet. ? Limiting the amount of carbohydrates and sugar in your diet.  Limit alcohol intake to no more than 1 drink a day for nonpregnant women and 2 drinks a day for men. One drink equals 12 oz of beer, 5 oz of wine, or 1 oz of hard liquor. General instructions  Maintain a healthy weight.  Stay physically active. Try to get at least 30 minutes of exercise on most or all days.  Find out if you have sleep apnea, and seek treatment if needed.  Do not use any products that contain nicotine  or tobacco, such as cigarettes and e-cigarettes. If you need help quitting, ask your health care provider.  Do not abuse drugs.  Keep all follow-up visits as told by your health care provider. This is important. Where to find more information:  American Stroke Association: www.strokeassociation.org  National Stroke Association: www.stroke.org Get help right away if:  You have chest pain or an irregular heartbeat.  You have any symptoms of stroke. The acronym BEFAST is an easy way to remember the main warning signs of stroke. ? B = Balance problems. Signs include dizziness, sudden trouble walking, or loss of balance. ? E = Eye problems. This includes trouble seeing or a sudden change in vision. ? F = Face changes. This includes sudden weakness or numbness of the face, or the face or eyelid drooping to one side. ? A = Arm weakness or numbness. This happens suddenly and usually on one side of the body. ? S = Speech problems. This includes trouble speaking or trouble understanding speech. ? T = Time. Time to call 911 or seek emergency care. Do not wait to see if symptoms will go away. Make note of the time your  symptoms started.  Other signs of stroke may include: ? A sudden, severe headache with no known cause. ? Nausea or vomiting. ? Seizure. These symptoms may represent a serious problem that is an emergency. Do not wait to see if the symptoms will go away. Get medical help right away. Call your local emergency services (911 in the [place] Do not drive yourself to the hospital. Summary  A TIA happens when an artery in the head or neck is blocked, leading to stroke-like symptoms that then go away quickly. The blockage clears before there is any permanent brain damage. A TIA is a medical emergency and requires immediate medical attention.  Symptoms of this condition are the same as those of a stroke, but they are temporary. The symptoms usually develop suddenly, and they go away quickly,  usually within minutes to hours.  Having a TIA means that you are at high risk of a stroke in the near future.  Treatment may include medicines to thin the blood as well as medicines, diet changes, and lifestyle changes to manage conditions that increase the risk of another TIA or a stroke. This information is not intended to replace advice given to you by your health care provider. Make sure you discuss any questions you have with your health care provider. Document Released: 02/13/2005 Document Revised: 06/18/2016 Document Reviewed: 06/18/2016 Elsevier Interactive Patient Education  2018 [employer].    IF you received an x-ray today, you will receive an invoice from [facility]. Please contact [facility] at [phone] with questions or concerns regarding your invoice.   IF you received labwork today, you will receive an invoice from [place]. Please contact [facility] at [phone] with questions or concerns regarding your invoice.   Our billing staff will not be able to assist you with questions regarding bills from these companies.  You will be contacted with the lab results as soon as they are available. The fastest way to get your results is to activate your My Chart account. Instructions are located on the last page of this paperwork. If you have not heard from [place] regarding the results in 2 weeks, please contact this office.

## 2017-12-20 NOTE — ED Notes (Signed)
Report called to [facility], RN - ready to accept pt

## 2017-12-20 NOTE — ED Triage Notes (Signed)
Pt to ER for evaluation of left facial numbness, left arm numbness, and slurred speech. Pt wife reports LSN 7 am. Pt in NAD. No drift noted.

## 2017-12-20 NOTE — Consult Note (Addendum)
NEURO HOSPITALIST      Requesting Physician: Dr. Erin Hearing     Chief Complaint: Left face, tongue and hand numbness; tingling/paresthesias, sensory changes   History obtained from:  Patient and chart review   HPI:                                                                                                                                         Johnny Sheppard is a 67 y.o. male with a PMH of HTN, HLD, previously diabetic with resolution of abnormal Hgb A1C, remote tobacco use, >10 yrs, asthma, presented to the ER with complaint of left face, tongue, hand numbness, tingling/paresthesias, sensory changes. Patient states that at approximately 0730 this morning he was completing his ADL's and noticed that when he went to raise his hand to his mouth on the left side of his mouth, he felt a tingling sensation. Patient states that he "assumed that asper cream accidentally got on his hand causing this sensation after touching his mouth." He also noticed sensory changes/ paraesthesias on his L hand, without weakness. Patient decided on helping neighbor with yard work without notice of weakness. Symptom did not resolve, prompting patient to present to urgent care. Patient was sent to ER for a stroke work-up and completed a CT head w/o contrast which resulted negative for acute, with mild cerebral atrophy and evidence of chronic small vessel disease noted. Further imaging with MRI Brain revealed an acute R thalamic infarct which correlates with his symptom presentation. He denies HA, SOB, CP, vision changes, facial droop. He admits that his symptoms of paresthesias have not totally resolved but are about 90% back to normal. He also admits to having remaining left sensory deficit to temperature.     Modified Rankin: Rankin Score=0 Alert; keenly responsive   LOC questions 0= answers both correctly  . Loc commands  0= both correctly  Visual fields 0= no  visual loss   Facial palsy  0= normal symmetric movements    no drift  Limb ataxia ( FNF and HTS) Dysmetria present in 1 limb   Sensory decrease on Left face, hand, leg  1= mild to moderate   best language  no aphasia without  dysarthria  Extinction or inattention no abnormality  TOTAL NIHSS 2   Past Medical History:  Diagnosis Date  . Arthritis   . Asthma   . H/O hematuria    Followed by urology  . Hyperlipidemia   . Hypertension     Past Surgical History:  Procedure Laterality Date  . COLON SURGERY     2012, had 1 polyp, Dr Benson [place]  Family History  Problem Relation Age of Onset  . Hypertension Sister    Social History:  reports that he quit smoking about 23 years ago. His smoking use included cigarettes. He has a 40.00 pack-year smoking history. He has never used smokeless tobacco. He reports that he does not drink alcohol or use drugs.  Allergies: No Known Allergies  Home Medications:                                                                                                                          Current Meds  Medication Sig  . acetaminophen (TYLENOL) 500 MG tablet Take 500-1,000 mg by mouth as needed for mild pain.  Marland Kitchen amLODipine (NORVASC) 5 MG tablet Take 1 tablet (5 mg total) by mouth daily. (Patient taking differently: Take 5 mg by mouth every evening. )  . aspirin 81 MG tablet Take 81 mg by mouth daily.  . Multiple Vitamins-Minerals (AIRBORNE PO) Take 1 tablet by mouth daily.  . simvastatin (ZOCOR) 20 MG tablet TAKE ONE TABLET BY MOUTH IN THE EVENING.    ROS:                                                                                                                                       History obtained from chart review and the patient  General ROS: negative for - chills, fatigue, fever, night sweats, weight gain or weight loss Psychological ROS: negative for - , hallucinations, memory difficulties, mood swings or  Ophthalmic ROS:  negative for - blurry vision, double vision, eye pain or loss of vision ENT ROS: negative for - epistaxis, nasal discharge, oral lesions, sore throat, tinnitus or vertigo Respiratory ROS: negative for - cough,  shortness of breath or wheezing Cardiovascular ROS: negative for - chest pain, dyspnea on exertion,  Gastrointestinal ROS: negative for - abdominal pain, diarrhea,  nausea/vomiting or stool incontinence Genito-Urinary ROS: negative for - dysuria, hematuria, incontinence or urinary frequency/urgency Musculoskeletal ROS: negative for - joint swelling or muscular weakness Neurological ROS: as noted in HPI   General Examination:  Blood pressure (!) 152/74, pulse 70, temperature 98.4 F (36.9 C), temperature source Oral, resp. rate 18, height 5\' 3"  (1.6 m), weight 80.7 kg (178 lb), SpO2 98 %.  GAD- pleasant and appropriate  HEENT-  Normocephalic, no lesions, without obvious abnormality.  Normal external eye and conjunctiva. Cardiovascular- S1-S2 audible, pulses palpable throughout   Lungs-no rhonchi or wheezing noted, no excessive working breathing.  Saturations within normal limits Abdomen- All 4 quadrants palpated and nontender Extremities- Warm, dry and intact Musculoskeletal-no joint tenderness, deformity or swelling Skin-warm and dry, no hyperpigmentation, vitiligo, or suspicious lesions  Neurological Examination Mental Status: Alert, oriented, thought content appropriate.  Speech fluent without evidence of aphasia.  Able to follow 3 step commands without difficulty. Cranial Nerves: II: Visual fields intact bilaterally. No extinction to DSS. PERRL III,IV, VI: ptosis not present, extra-ocular motions intact bilaterally V,VII: smile symmetric, facial light touch sensation normal bilaterally. Decreased temp sensation in left V2 and V3 distribution VIII: hearing normal  bilaterally IX,X: Palate rises symmetrically XI: Symmetric shoulder shrug XII: midline tongue extension Motor: Right : Upper extremity   5/5    Left:     Upper extremity   5/5  Lower extremity   5/5     Lower extremity   5/5 Tone and bulk:normal tone throughout; no atrophy noted Sensory: Temp sensation decreased to left hand and lower leg. FT intact x 4. No extinction.   Deep Tendon Reflexes: 2+ and symmetric throughout Plantars: Right: downgoing   Left: downgoing Cerebellar: Mildly ataxic FNF on left.  Gait: normal gait did not test   Lab Results: Basic Metabolic Panel: Recent Labs  Lab 12/20/17 1205  NA 138  K 4.2  CL 104  CO2 26  GLUCOSE 96  BUN 12  CREATININE 1.08  CALCIUM 9.2    CBC: Recent Labs  Lab 12/20/17 1205  WBC 7.7  NEUTROABS 4.0  HGB 14.1  HCT 40.5  MCV 83.3  PLT 262    Lipid Panel: No results for input(s): CHOL, TRIG, HDL, CHOLHDL, VLDL, LDLCALC in the last 168 hours.  CBG: Recent Labs  Lab 12/20/17 1211 12/20/17 1511  GLUCAP 88 86    Imaging: Ct Head Wo Contrast  Result Date: 12/20/2017 CLINICAL DATA:  Left facial and arm numbness and slurred speech for several hours. Focal neuro deficit, < 6 hrs, stroke suspected EXAM: CT HEAD WITHOUT CONTRAST TECHNIQUE: Contiguous axial images were obtained from the base of the skull through the vertex without intravenous contrast. COMPARISON:  None. FINDINGS: Brain: No evidence of acute infarction, hemorrhage, hydrocephalus, extra-axial collection, or mass lesion/mass effect. Mild cerebral atrophy and chronic small vessel disease. Vascular:  No hyperdense vessel or other acute findings. Skull: No evidence of fracture or other significant bone abnormality. Sinuses/Orbits:  No acute findings. Other: None. IMPRESSION: No acute intracranial abnormality. Mild cerebral atrophy and chronic small vessel disease. Electronically Signed   By: Earle Gell M.D.   On: 12/20/2017 13:27   Mr Brain Wo Contrast  Result Date:  12/20/2017 CLINICAL DATA:  Acute onset of left facial numbness and tingling beginning today. Left hand numbness. Left leg weakness. Negative head CT. EXAM: MRI HEAD WITHOUT CONTRAST TECHNIQUE: Multiplanar, multiecho pulse sequences of the brain and surrounding structures were obtained without intravenous contrast. COMPARISON:  CT same day FINDINGS: Brain: Diffusion imaging shows acute infarction in the lateral right thalamus, under a cm in size. No other acute infarction. No swelling or hemorrhage. Elsewhere, the brainstem and cerebellum are normal. Cerebral hemispheres show moderate  chronic small-vessel ischemic changes of the deep and subcortical white matter. No large vessel territory infarction. No mass lesion, hemorrhage, hydrocephalus or extra-axial collection. Vascular: Major vessels at the base of the brain show flow. Skull and upper cervical spine: Negative Sinuses/Orbits: Clear/normal Other: None IMPRESSION: Acute subcentimeter infarction in the lateral right thalamus. Moderate chronic small-vessel ischemic changes of the cerebral hemispheric white matter. Electronically Signed   By: Nelson Chimes M.D.   On: 12/20/2017 16:21    Laurey Morale, MSN, NP-C [facility] [phone] 12/20/2017, 4:58 PM   Assessment: 67 y.o. male with PMH of HTN, HLD, previously diabetic with resolution of abnormal Hgb A1C, remote tobacco use, >10 yrs, asthma, presented to the ER with complaint of Left face, tongue, hand Numbness, tingling/Parathesias, sensory changes.  1. MRI Brain demonstrated an acute R thalamic infarct which correlates with his symptom presentation.  2. MRA head and neck pending to r/o evidence of large to medium vessel disease.   3. Etiology of stroke most likely chronic small vessel disease (lipohyalinosis) secondary to chronic HTN.  Stroke risks Discussed patient's health history and patient has been compliant with diabetes by decreasing Hgb A1C to resolution 5.8. He admits that he  does take his BP med's daily but does not monitor for therapeutic affect other than when he presents to providers office despite owning a cuff. We have discussed the risk associated with uncontrolled HTN and correlation with small vessle disease and strokes. 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 Patient does take home dose of ASA 162 mg daily which was suggested "by his aunt" because of their family history of CAD. Lastly, patient is currently taking simvastatin for HLD since he previously had taken atorvastatin and experienced myalgias.       Recommendations: Increase- ASA to 325 mg po daily  -MRA head and neck pending completion.  -- BP goal : 110-140- normotensive  --Echocardiogram -- Prophylactic therapy and SCD's  -- Home dose of simvastatin to resume with possible increase based on lipid panel results in am- No atorvastatin given hx of myalgia with this medication -- HgbA1c, fasting lipid panel  -- PT consult, OT consult, Speech consult --Telemetry monitoring -- NIHSS per protocol  --Stroke swallow screen (NPO until official swallow screen if you think patient will fail)  - Patient educated on blood pressure monitoring and trending, as well as outpatient f/u to r/o OSA --Please page stroke NP  Or  PA  Or MD from 8am -4 pm  as this patient from this time will be  followed by the stroke.   You can look them up on [email or web address]  Password [email or web address]  I have seen and examined the patient. I have amended the assessment and recommendations above Electronically signed: Dr. Kerney Elbe

## 2017-12-20 NOTE — Progress Notes (Signed)
    MRN: [id number] DOB: 09-10-1950  Subjective:   Johnny Sheppard is a 67 y.o. male presenting for acute onset this morning of left-sided facial numbness and tingling, left hand numbness and burning sensation, left leg weakness.  Patient also endorses lightheadedness.  Denies headache, confusion, vision changes, cough, chest pain, shortness of breath, heart racing, palpitations, nausea, vomiting, belly pain.  His cardiovascular risk factors are hypertension and dyslipidemia, history of diabetes. He reports that he quit smoking about 23 years ago. His smoking use included cigarettes. He has a 40.00 pack-year smoking history. He has never used smokeless tobacco. He reports that he does not drink alcohol or use drugs.  [name] has a current medication list which includes the following prescription(s): amlodipine, aspirin, and simvastatin. Also has No Known Allergies.  [name]  has a past medical history of Arthritis, Asthma, H/O hematuria, Hyperlipidemia, and Hypertension. Also  has a past surgical history that includes Colon surgery.  His family history includes Hypertension in his sister.   Objective:   Vitals: BP 134/77   Pulse 70   Temp 98.1 F (36.7 C)   Resp 16   Ht 5\' 3"  (1.6 m)   Wt 179 lb (81.2 kg)   SpO2 97%   BMI 31.71 kg/m   Physical Exam  Constitutional: He is oriented to person, place, and time. He appears well-developed and well-nourished.  HENT:  Mouth/Throat: Oropharynx is clear and moist.  Eyes: Pupils are equal, round, and reactive to light. EOM are normal. Right eye exhibits no discharge. Left eye exhibits no discharge. No scleral icterus.  Neck: Normal range of motion. Neck supple.  Cardiovascular: Normal rate, regular rhythm, normal heart sounds and intact distal pulses. Exam reveals no gallop and no friction rub.  No murmur heard. No carotid bruits.  Pulmonary/Chest: Effort normal and breath sounds normal. No stridor. No respiratory distress. He has no wheezes. He  has no rales.  Neurological: He is alert and oriented to person, place, and time. He displays normal reflexes. No cranial nerve deficit. Coordination normal.  Skin: Skin is warm and dry. Capillary refill takes less than 2 seconds.  Psychiatric: He has a normal mood and affect.   Assessment and Plan :   Left facial numbness  Numbness and tingling in left hand  Burning sensation  Stroke-like symptoms  Left leg weakness  Essential hypertension  Dyslipidemia  History of elevated glucose  Patient is to report to the ER immediately for ruling out an intracranial process/cardiovascular event.  Physical exam findings are reassuring but symptoms that is very concerning for TIA versus start of a stroke.  Counseled patient and his wife on differential, they agreed to report to the ER by personal vehicle.  Wife contracted for safety, will drive patient there.  Jaynee Eagles, PA-C [facility] [phone] 12/20/2017  11:23 AM

## 2017-12-21 ENCOUNTER — Encounter (HOSPITAL_COMMUNITY): Payer: Medicare Other

## 2017-12-21 ENCOUNTER — Observation Stay (HOSPITAL_COMMUNITY): Payer: Medicare Other

## 2017-12-21 DIAGNOSIS — I639 Cerebral infarction, unspecified: Principal | ICD-10-CM

## 2017-12-21 DIAGNOSIS — I63431 Cerebral infarction due to embolism of right posterior cerebral artery: Secondary | ICD-10-CM | POA: Diagnosis not present

## 2017-12-21 DIAGNOSIS — I6381 Other cerebral infarction due to occlusion or stenosis of small artery: Secondary | ICD-10-CM

## 2017-12-21 LAB — LIPID PANEL
Cholesterol: 150 mg/dL (ref 0–200)
HDL: 38 mg/dL — ABNORMAL LOW (ref >40)
LDL CALC: 93 mg/dL (ref 0–99)
Total CHOL/HDL Ratio: 3.9 RATIO
Triglycerides: 93 mg/dL (ref <150)
VLDL: 19 mg/dL (ref 0–40)

## 2017-12-21 LAB — HIV ANTIBODY (ROUTINE TESTING W REFLEX): HIV Screen 4th Generation wRfx: NONREACTIVE

## 2017-12-21 LAB — HEMOGLOBIN A1C
Hgb A1c MFr Bld: 6.1 % — ABNORMAL HIGH (ref 4.8–5.6)
MEAN PLASMA GLUCOSE: 128.37 mg/dL

## 2017-12-21 MED ORDER — SIMVASTATIN 40 MG PO TABS
40.0000 mg | ORAL_TABLET | Freq: Every day | ORAL | Status: DC
  Administered 2017-12-21: 40 mg via ORAL
  Filled 2017-12-21: qty 1

## 2017-12-21 MED ORDER — ASPIRIN 325 MG PO TABS: 325.0000 mg | ORAL_TABLET | Freq: Every day | ORAL | 0 refills | Status: DC

## 2017-12-21 MED ORDER — SIMVASTATIN 40 MG PO TABS: 40.0000 mg | ORAL_TABLET | Freq: Every day | ORAL | 0 refills | Status: DC

## 2017-12-21 MED ORDER — CLOPIDOGREL BISULFATE 75 MG PO TABS: 75.0000 mg | ORAL_TABLET | Freq: Every day | ORAL | 0 refills | Status: DC

## 2017-12-21 NOTE — Progress Notes (Signed)
PT Cancellation Note  Patient Details Name: Johnny Sheppard MRN: [id number] DOB: Jun 01, 1950   Cancelled Treatment:    Reason Eval/Treat Not Completed: PT screened, no needs identified, will sign off. Pt is independent with all functional mobility. OT to address all therapy needs due to primary UE involvement.    Lorriane Shire 12/21/2017, 9:43 AM   Lorrin Goodell, PT  Office # [phone] Pager [phone]

## 2017-12-21 NOTE — Discharge Instructions (Signed)
You were admitted for stroke evidenced on MRI of your brain. The symptoms of facial and arm/hand numbness and tingling were due to area of brain that was affected. It is important to take your blood pressure every day for the next couple of weeks and take your medication every day as prescribed. You should follow up with your primary doctor in 2-3 days. If you develop chest pain, shortness of breath, or new weakness/numbness/tingling that persists longer than 20 minutes, you should go to the ED.

## 2017-12-21 NOTE — Progress Notes (Signed)
[name] Cl[place] discharged home with wife. AVS went over with and given to  patient. Signed copy in chart.  Patient taken to discharge lobby via ambulation by NT.   Vitals:   12/21/17 1223 12/21/17 1611  BP: 114/84 129/77  Pulse: 70 67  Resp: 18 18  Temp: 98.7 F (37.1 C) 98.4 F (36.9 C)  SpO2: 97% 97%     Julieanne Cotton, RN

## 2017-12-21 NOTE — Evaluation (Addendum)
Occupational Therapy Evaluation Patient Details Name: Johnny Sheppard MRN: [id number] DOB: 05/01/1951 Today's Date: 12/21/2017    History of Present Illness 67 y.o. male who presented with L facial, lip, and arm numbness and paraesthesias that was subsequently found to have a right lateral thalamic infarct. PMH is significant for hypertension, hyperlipidemia, diet controlled DM,h/o tobacco use, asthma, and arthritis.    Clinical Impression   This 67 y/o male presents with the above. At baseline pt is independent with ADLs, iADLs and functional mobility, works as a Administrator. Pt very pleasant and willing to participate in therapy this session. Pt demonstrated room and hallway level functional mobility without AD, completing multiple higher level balance challenges and stair navigation with overall supervision and no LOB. Pt completing standing grooming, UB and LB ADLs with supervision-mod independence. Pt reports feeling close to baseline regarding ADL and mobility completion. He does report some continued numbness and decreased coordination with LUE use and noted during formal assessment, though does not appear to impact his ability to perform daily ADL tasks. Issued pt [facility] handout for increasing pt's fine motor strength and coordination and recommend follow up neuro outpatient OT services to further address pt's UE deficits. Will continue to follow while he remains in acute setting to progress pt towards established OT goals.     Follow Up Recommendations  Outpatient OT;Supervision - Intermittent(outpatient neuro OT )    Equipment Recommendations  None recommended by OT           Precautions / Restrictions Precautions Precautions: None Restrictions Weight Bearing Restrictions: No      Mobility Bed Mobility               General bed mobility comments: OOB upon arrival   Transfers Overall transfer level: Modified independent                    Balance Overall  balance assessment: Modified Independent                                         ADL either performed or assessed with clinical judgement   ADL Overall ADL's : At baseline                                       General ADL Comments: Pt demonstrating standing grooming, LB and UB ADLs with distant supervision throughout (overall mod independent); pt participating in hallway level mobility given cognitive and balance challenges and pt completing without difficulty, completing balance challenges (majority of DGI items including head turns, gait speed changes, navigating stairs, stepping over items, weaving around items) with minguard-supervision assist throughout     Vision Baseline Vision/History: Wears glasses Wears Glasses: Distance only Patient Visual Report: (initial blurring of vision, reports has since subsided ) Vision Assessment?: Yes;No apparent visual deficits Eye Alignment: Within Functional Limits Ocular Range of Motion: Within Functional Limits Alignment/Gaze Preference: Within Defined Limits Tracking/Visual Pursuits: Able to track stimulus in all quads without difficulty Visual Fields: No apparent deficits     Perception     Praxis      Pertinent Vitals/Pain Pain Assessment: No/denies pain     Hand Dominance Right   Extremity/Trunk Assessment Upper Extremity Assessment Upper Extremity Assessment: LUE deficits/detail LUE Deficits / Details: reports continued numbness in ulnar aspect  of L hand; decreased smoothness/accuracy with finger to nose; UE strength appears WFL  LUE Sensation: decreased light touch   Lower Extremity Assessment Lower Extremity Assessment: Overall WFL for tasks assessed;LLE deficits/detail LLE Deficits / Details: pt reports feeling a "pressure" under L foot intermittently; reports feeling mild discoordination with mobility though was not notable during hallway mobility    Cervical / Trunk Assessment Cervical /  Trunk Assessment: Normal   Communication Communication Communication: No difficulties   Cognition Arousal/Alertness: Awake/alert Behavior During Therapy: WFL for tasks assessed/performed Overall Cognitive Status: Within Functional Limits for tasks assessed                                     General Comments       Exercises Other Exercises Other Exercises: issued pt fine motor/coorindation HEP and reviewed    Shoulder Instructions      Home Living Family/patient expects to be discharged to:: Private residence Living Arrangements: Spouse/significant other Available Help at Discharge: Family Type of Home: House Home Access: Stairs to enter Technical brewer of Steps: 2 to porch and then 1 into home    Home Layout: One level     Bathroom Shower/Tub: Tub/shower unit;Walk-in shower   Bathroom Toilet: Standard     Home Equipment: None          Prior Functioning/Environment Level of Independence: Independent        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 Problem List: Impaired UE functional use;Impaired sensation      OT Treatment/Interventions: Neuromuscular education;Therapeutic exercise;Patient/family education    OT Goals(Current goals can be found in the care plan section) Acute Rehab OT Goals Patient Stated Goal: return home; regain full independence  OT Goal Formulation: With patient Time For Goal Achievement: 01/04/18 Potential to Achieve Goals: Good  OT Frequency: Min 2X/week   Barriers to D/C:            Co-evaluation              AM-PAC PT "6 Clicks" Daily Activity     Outcome Measure Help from another person eating meals?: None Help from another person taking care of personal grooming?: None Help from another person toileting, which includes using toliet, bedpan, or urinal?: None Help from another person bathing (including washing, rinsing, drying)?: None Help from another person to put on and taking off  regular upper body clothing?: None Help from another person to put on and taking off regular lower body clothing?: None 6 Click Score: 24   End of Session Equipment Utilized During Treatment: Gait belt Nurse Communication: Mobility status  Activity Tolerance: Patient tolerated treatment well Patient left: with call bell/phone within reach;with family/visitor present;Other (comment)(sitting EOB )  OT Visit Diagnosis: Other symptoms and signs involving the nervous system (E72.094)                Time: 7096-2836 OT Time Calculation (min): 28 min Charges:  OT General Charges $OT Visit: 1 Visit OT Evaluation $OT Eval Moderate Complexity: 1 Mod OT Treatments $Self Care/Home Management : 8-22 mins  Lou Cal, OT Pager [phone] 12/21/2017   Raymondo Band 12/21/2017, 10:46 AM

## 2017-12-21 NOTE — Progress Notes (Signed)
VASCULAR LAB    Patient had normal MRA of the neck. Please advise if carotid duplex still needed.  Thank you,  Nadean Montanaro, RVT 12/21/2017, 10:30 AM

## 2017-12-21 NOTE — Progress Notes (Signed)
STROKE TEAM PROGRESS NOTE   HISTORY OF PRESENT ILLNESS (per record) Johnny Sheppard is a 67 y.o. male with a PMH of HTN, HLD, previously diabetic with resolution of abnormal Hgb A1C, remote tobacco use, >10 yrs, asthma, presented to the ER with complaint of left face, tongue, hand numbness, tingling/paresthesias, sensory changes. Patient states that at approximately 0730 this morning he was completing his ADL's and noticed that when he went to raise his hand to his mouth on the left side of his mouth, he felt a tingling sensation. Patient states that he "assumed that asper cream accidentally got on his hand causing this sensation after touching his mouth." He also noticed sensory changes/ paraesthesias on his L hand, without weakness. Patient decided on helping neighbor with yard work without notice of weakness. Symptom did not resolve, prompting patient to present to urgent care. Patient was sent to ER for a stroke work-up and completed a CT head w/o contrast which resulted negative for acute, with mild cerebral atrophy and evidence of chronic small vessel disease noted. Further imaging with MRI Brain revealed an acute R thalamic infarct which correlates with his symptom presentation. He denies HA, SOB, CP, vision changes, facial droop. He admits that his symptoms of paresthesias have not totally resolved but are about 90% back to normal. He also admits to having remaining left sensory deficit to temperature.    TOTAL NIHSS 2    SUBJECTIVE (INTERVAL HISTORY) Feeling improved, family at bedside, family says he snores terribly    OBJECTIVE Temp:  [97.8 F (36.6 C)-98.7 F (37.1 C)] 98.7 F (37.1 C) (08/04 1223) Pulse Rate:  [63-72] 70 (08/04 1223) Cardiac Rhythm: Normal sinus rhythm;Heart block (08/04 0806) Resp:  [18-21] 18 (08/04 1223) BP: (114-156)/(79-105) 114/84 (08/04 1223) SpO2:  [95 %-98 %] 97 % (08/04 1223) Weight:  [172 lb 13.5 oz (78.4 kg)] 172 lb 13.5 oz (78.4 kg) (08/03  1807)  CBC:  Recent Labs  Lab 12/20/17 1205  WBC 7.7  NEUTROABS 4.0  HGB 14.1  HCT 40.5  MCV 83.3  PLT 295    Basic Metabolic Panel:  Recent Labs  Lab 12/20/17 1205  NA 138  K 4.2  CL 104  CO2 26  GLUCOSE 96  BUN 12  CREATININE 1.08  CALCIUM 9.2    Lipid Panel:     Component Value Date/Time   CHOL 150 12/21/2017 0551   CHOL 154 05/05/2017 1812   TRIG 93 12/21/2017 0551   HDL 38 (L) 12/21/2017 0551   HDL 39 (L) 05/05/2017 1812   CHOLHDL 3.9 12/21/2017 0551   VLDL 19 12/21/2017 0551   LDLCALC 93 12/21/2017 0551   LDLCALC 51 05/05/2017 1812   HgbA1c:  Lab Results  Component Value Date   HGBA1C 6.1 (H) 12/21/2017   Urine Drug Screen: No results found for: LABOPIA, COCAINSCRNUR, LABBENZ, AMPHETMU, THCU, LABBARB  Alcohol Level No results found for: ETH  IMAGING   Ct Head Wo Contrast 12/20/2017 IMPRESSION:  No acute intracranial abnormality. Mild cerebral atrophy and chronic small vessel disease.    Mr Johnny Sheppard Head Wo Contrast Mr Johnny Sheppard Neck Wo Contrast 12/21/2017 IMPRESSION:  1. No large vessel occlusion.  2. Mild proximal right P2 stenosis.  3. 2 mm right cavernous ICA aneurysm.  4. Negative neck MRA.    Mr Brain Wo Contrast 12/20/2017 IMPRESSION:  Acute subcentimeter infarction in the lateral right thalamus. Moderate chronic small-vessel ischemic changes of the cerebral hemispheric white matter.     Transthoracic Echocardiogram -  pending 00/00/00      PHYSICAL EXAM Vitals:   12/21/17 0020 12/21/17 0500 12/21/17 0758 12/21/17 1223  BP: 136/79 (!) 135/97 (!) 143/83 114/84  Pulse:  72 63 70  Resp:  18 18 18   Temp: 98.4 F (36.9 C) 98 F (36.7 C) 98.5 F (36.9 C) 98.7 F (37.1 C)  TempSrc: Oral Oral Oral Oral  SpO2: 96% 95% 96% 97%  Weight:      Height:        Physical exam: Exam: Gen: NAD, conversant, well nourised, well groomed                     CV: RRR, no MRG. No Carotid Bruits. No peripheral edema, warm, nontender Eyes:  Conjunctivae clear without exudates or hemorrhage  Neuro: Detailed Neurologic Exam  Speech:    Speech is normal; fluent and spontaneous with normal comprehension.  Cognition:    The patient is oriented to person, place, and time;     recent and remote memory intact;     language fluent;     normal attention, concentration,     fund of knowledge Cranial Nerves:    The pupils are equal, round, and reactive to light. The fundi are normal and spontaneous venous pulsations are present. Visual fields are full to finger confrontation. Extraocular movements are intact. decr sensation left face. . The face is symmetric. The palate elevates in the midline. Hearing intact. Voice is normal. Shoulder shrug is normal. The tongue has normal motion without fasciculations.   Coordination:    Left ataxia  Motor Observation:    No asymmetry, no atrophy, and no involuntary movements noted. Tone:    Normal muscle tone.     Strength:    Strength is V/V in the upper and lower limbs.      Sensation: left hemisensory changes     Reflex Exam:  DTR's:    Deep tendon reflexes in the upper and lower symmetric are normal bilaterally.   Toes:    The toes are downgoing bilaterally.   Clonus:    Clonus is absent.       ASSESSMENT/PLAN Johnny Sheppard is a 67 y.o. male with history o f HTN, HLD, previously diabetic, former smoker, and asthma presenting with Lt sided numbness. He did not receive IV t-PA due to late presentation.  Stroke:  acute R thalamic infarct - small vessel disease  Resultant  Left hemisensory changes  CT head - No acute intracranial abnormality.  MRI head - Acute subcentimeter infarction in the lateral right thalamus.   MRA head - 2 mm right cavernous ICA aneurysm.   MRA neck - negative  Carotid Doppler - MRA neck negative  2D Echo - pending  LDL - 93  HgbA1c - 6.1  VTE prophylaxis - [place] Heparin Diet Order           Diet Heart Room service appropriate? Yes;  Fluid consistency: Thin  Diet effective now          aspirin 81 mg daily prior to admission, now on aspirin 325 mg daily  Patient counseled to be compliant with his antithrombotic medications  Ongoing aggressive stroke risk factor management  Therapy recommendations:  pending  Disposition:  Pending  Hypertension  Stable . Permissive hypertension (OK if < 220/120) but gradually normalize in 5-7 days . Long-term BP goal normotensive  Hyperlipidemia  Lipid lowering medication PTA:  Zocor 20 mg daily  LDL 93, goal < 70  Current lipid lowering medication: Increase Zocor to 40 mg daily  Continue statin at discharge    Other Stroke Risk Factors  Advanced age  Former cigarette smoker - quit  Obesity, Body mass index is 30.62 kg/m., recommend weight loss, diet and exercise as appropriate     Other Active Problems  Recommend ASA and plavix for 3 weeks on discharge and then plavix alone  F/u with jessica at [facility] in 6-8 weeks  Snores, needs OSA eval outpatinet  Follow ICA aneurysm outpatient  Stroke will sign off at this time     Hospital day # 0  Personally examined patient and images, and have participated in and made any corrections needed to history, physical, neuro exam,assessment and plan as stated above.  I have personally obtained the history, evaluated lab date, reviewed imaging studies and agree with radiology interpretations.    Sarina Ill, MD Stroke Neurology    To contact Stroke Continuity provider, please refer to [email or web address] After hours, contact General Neurology

## 2017-12-21 NOTE — Plan of Care (Signed)
  Problem: Education: Goal: Knowledge of General Education information will improve Description Including pain rating scale, medication(s)/side effects and non-pharmacologic comfort measures Outcome: Completed/Met   Problem: Health Behavior/Discharge Planning: Goal: Ability to manage health-related needs will improve Outcome: Completed/Met   Problem: Clinical Measurements: Goal: Ability to maintain clinical measurements within normal limits will improve Outcome: Completed/Met Goal: Will remain free from infection Outcome: Completed/Met Goal: Diagnostic test results will improve Outcome: Completed/Met Goal: Respiratory complications will improve Outcome: Completed/Met Goal: Cardiovascular complication will be avoided Outcome: Completed/Met   Problem: Activity: Goal: Risk for activity intolerance will decrease Outcome: Completed/Met   Problem: Coping: Goal: Level of anxiety will decrease Outcome: Completed/Met   Problem: Elimination: Goal: Will not experience complications related to bowel motility Outcome: Completed/Met Goal: Will not experience complications related to urinary retention Outcome: Completed/Met   Problem: Pain Managment: Goal: General experience of comfort will improve Outcome: Completed/Met   Problem: Safety: Goal: Ability to remain free from injury will improve Outcome: Completed/Met   Problem: Skin Integrity: Goal: Risk for impaired skin integrity will decrease Outcome: Completed/Met   Problem: Education: Goal: Knowledge of disease or condition will improve Outcome: Completed/Met Goal: Knowledge of secondary prevention will improve Outcome: Completed/Met Goal: Knowledge of patient specific risk factors addressed and post discharge goals established will improve Outcome: Completed/Met   Problem: Coping: Goal: Will verbalize positive feelings about self Outcome: Completed/Met   Problem: Health Behavior/Discharge Planning: Goal: Ability to  manage health-related needs will improve Outcome: Completed/Met   Problem: Self-Care: Goal: Ability to participate in self-care as condition permits will improve Outcome: Completed/Met Goal: Verbalization of feelings and concerns over difficulty with self-care will improve Outcome: Completed/Met Goal: Ability to communicate needs accurately will improve Outcome: Completed/Met   Problem: Nutrition: Goal: Risk of aspiration will decrease Outcome: Completed/Met Goal: Dietary intake will improve Outcome: Completed/Met   Problem: Spiritual Needs Goal: Ability to function at adequate level Outcome: Completed/Met

## 2017-12-21 NOTE — Progress Notes (Addendum)
Patient wife concerned about patient returning to work tomorrow. He is a Administrator. MD paged and inquired about how long/if at all patient is to stay out of work. Awaiting response.

## 2017-12-21 NOTE — Discharge Summary (Signed)
[facility] Discharge Summary  Patient name: Johnny Sheppard Medical record number: [id number] Date of birth: [birth year] Age: 67 y.o. Gender: male Date of Admission: 12/20/2017  Date of Discharge: 12/21/17 Admitting Physician: Lind Covert, MD  Primary Care Provider: Forrest Moron, MD Consultants: Neurology  Indication for Hospitalization: New stroke  Discharge Diagnoses/Problem List:  Acute R Ischemic thalamic stroke, stable HTN, stable HLD, stable T2DM, stable  Disposition: home  Discharge Condition: Stable  Discharge Exam:  General: laying in bed comfortably, in NAD Cardiovascular: RRR, no murmurs  Respiratory: CTAB, normal effort on RA  Abdomen: soft, nontender, nondistended, + bowel sounds  Extremities: WWP, no LE edema Neuro: awake, alert. Slight sensation decrease in L face and L UE. Otherwise grossly normal. Strength 5/5. Performed by Dr. Shawna Orleans on the day of discharge.  Brief Hospital Course:  Johnny Sheppard a 67 y.o.male with PMH significant for hypertension, hyperlipidemia, diet controlled DM, h/o tobacco use, asthma,andarthritis who presented with L facial, lip, arm, and leg numbness and paraesthesias that was subsequently found to have a rightlateralthalamic infarct on MRI brain. He presented outside of 4 hour window and therefore TPA was not administered. Neurology was consulted in the ED who followed throughout hospitalization. MRA head and neck performed and significant for 36mm R cavernous ICA aneurysm but no stenosis. His blood pressure remained mostly normotensive throughout his admission on his home regimen. His lipid panel was significant for LDL of 93, his simvastatin dose was increased to 40mg  given current goal of LDL <70 for diabetics. PT/OT were consulted who recommended outpatient OT, otherwise he did not have any motor deficits. Given his excellent functional status, he was discharged with outpatient stroke clinic  follow up and likely outpatient ECHO at that time.  Issues for Follow Up:  1. Medication Changes: 1. Increased simvastatin to 40mg  2. Continue on ASA 81mg  and Plavix 75mg  for 21 days, followed by Plavix alone. 2. Will follow up in Stroke Clinic in 8 weeks, at that time will likely receive outpatient ECHO. 3. Per Neurology, will be followed outpatient for aneurysm. 4. Will need outpatient sleep study to evaluate for OSA.  Significant Procedures: None  Significant Labs and Imaging:  Recent Labs  Lab 12/20/17 1205  WBC 7.7  HGB 14.1  HCT 40.5  PLT 262   Recent Labs  Lab 12/20/17 1205  NA 138  K 4.2  CL 104  CO2 26  GLUCOSE 96  BUN 12  CREATININE 1.08  CALCIUM 9.2  ALKPHOS 71  AST 24  ALT 22  ALBUMIN 4.0   Ct Head Wo Contrast  Result Date: 12/20/2017 CLINICAL DATA:  Left facial and arm numbness and slurred speech for several hours. Focal neuro deficit, < 6 hrs, stroke suspected EXAM: CT HEAD WITHOUT CONTRAST TECHNIQUE: Contiguous axial images were obtained from the base of the skull through the vertex without intravenous contrast. COMPARISON:  None. FINDINGS: Brain: No evidence of acute infarction, hemorrhage, hydrocephalus, extra-axial collection, or mass lesion/mass effect. Mild cerebral atrophy and chronic small vessel disease. Vascular:  No hyperdense vessel or other acute findings. Skull: No evidence of fracture or other significant bone abnormality. Sinuses/Orbits:  No acute findings. Other: None. IMPRESSION: No acute intracranial abnormality. Mild cerebral atrophy and chronic small vessel disease. Electronically Signed   By: Earle Gell M.D.   On: 12/20/2017 13:27   Mr Jodene Nam Head Wo Contrast  Result Date: 12/21/2017 CLINICAL DATA:  Stroke follow-up.  Right thalamic infarct on MRI. EXAM: MRA  NECK WITHOUT CONTRAST MRA HEAD WITHOUT CONTRAST TECHNIQUE: Multiplanar and multiecho pulse sequences of the neck were obtained without intravenous contrast. Angiographic images of the  neck were obtained using MRA technique without intravenous contast. Angiographic images of the Circle of Willis were obtained using MRA technique without intravenous contrast. COMPARISON:  None. FINDINGS: MRA NECK FINDINGS There is a normal variant aortic arch branching pattern with common origin of the brachiocephalic and left common carotid arteries. The carotid arteries are widely patent in the neck bilaterally without evidence of stenosis or dissection. Both cervical ICAs are tortuous. The vertebral arteries are patent with antegrade flow bilaterally and no evidence of significant stenosis or dissection. The left vertebral artery is mildly dominant. MRA HEAD FINDINGS The visualized distal vertebral arteries are widely patent to the basilar with the left being slightly dominant. Patent right PICA, bilateral AICA, and bilateral SCA origins are visualized. The basilar artery is widely patent. Posterior communicating arteries are not identified and may be diminutive or absent. PCAs are patent with a mild proximal P2 stenosis noted on the right. The internal carotid arteries are widely patent from skull base to carotid termini. A 2 mm laterally projecting outpouching from the right cavernous ICA suggests a small aneurysm. ACAs and MCAs are patent without evidence of proximal branch occlusion or significant proximal stenosis. IMPRESSION: 1. No large vessel occlusion. 2. Mild proximal right P2 stenosis. 3. 2 mm right cavernous ICA aneurysm. 4. Negative neck MRA. Electronically Signed   By: Logan Bores M.D.   On: 12/21/2017 08:18   Mr Jodene Nam Neck Wo Contrast  Result Date: 12/21/2017 CLINICAL DATA:  Stroke follow-up.  Right thalamic infarct on MRI. EXAM: MRA NECK WITHOUT CONTRAST MRA HEAD WITHOUT CONTRAST TECHNIQUE: Multiplanar and multiecho pulse sequences of the neck were obtained without intravenous contrast. Angiographic images of the neck were obtained using MRA technique without intravenous contast. Angiographic  images of the Circle of Willis were obtained using MRA technique without intravenous contrast. COMPARISON:  None. FINDINGS: MRA NECK FINDINGS There is a normal variant aortic arch branching pattern with common origin of the brachiocephalic and left common carotid arteries. The carotid arteries are widely patent in the neck bilaterally without evidence of stenosis or dissection. Both cervical ICAs are tortuous. The vertebral arteries are patent with antegrade flow bilaterally and no evidence of significant stenosis or dissection. The left vertebral artery is mildly dominant. MRA HEAD FINDINGS The visualized distal vertebral arteries are widely patent to the basilar with the left being slightly dominant. Patent right PICA, bilateral AICA, and bilateral SCA origins are visualized. The basilar artery is widely patent. Posterior communicating arteries are not identified and may be diminutive or absent. PCAs are patent with a mild proximal P2 stenosis noted on the right. The internal carotid arteries are widely patent from skull base to carotid termini. A 2 mm laterally projecting outpouching from the right cavernous ICA suggests a small aneurysm. ACAs and MCAs are patent without evidence of proximal branch occlusion or significant proximal stenosis. IMPRESSION: 1. No large vessel occlusion. 2. Mild proximal right P2 stenosis. 3. 2 mm right cavernous ICA aneurysm. 4. Negative neck MRA. Electronically Signed   By: Logan Bores M.D.   On: 12/21/2017 08:18   Mr Brain Wo Contrast  Result Date: 12/20/2017 CLINICAL DATA:  Acute onset of left facial numbness and tingling beginning today. Left hand numbness. Left leg weakness. Negative head CT. EXAM: MRI HEAD WITHOUT CONTRAST TECHNIQUE: Multiplanar, multiecho pulse sequences of the brain and surrounding structures  were obtained without intravenous contrast. COMPARISON:  CT same day FINDINGS: Brain: Diffusion imaging shows acute infarction in the lateral right thalamus, under a  cm in size. No other acute infarction. No swelling or hemorrhage. Elsewhere, the brainstem and cerebellum are normal. Cerebral hemispheres show moderate chronic small-vessel ischemic changes of the deep and subcortical white matter. No large vessel territory infarction. No mass lesion, hemorrhage, hydrocephalus or extra-axial collection. Vascular: Major vessels at the base of the brain show flow. Skull and upper cervical spine: Negative Sinuses/Orbits: Clear/normal Other: None IMPRESSION: Acute subcentimeter infarction in the lateral right thalamus. Moderate chronic small-vessel ischemic changes of the cerebral hemispheric white matter. Electronically Signed   By: Nelson Chimes M.D.   On: 12/20/2017 16:21   Results/Tests Pending at Time of Discharge: none  Discharge Medications:  Allergies as of 12/21/2017      Reactions   Atorvastatin    Muscle/ skeletal pain       Medication List    TAKE these medications   acetaminophen 500 MG tablet Commonly known as:  TYLENOL Take 500-1,000 mg by mouth as needed for mild pain.   AIRBORNE PO Take 1 tablet by mouth daily.   amLODipine 5 MG tablet Commonly known as:  NORVASC Take 1 tablet (5 mg total) by mouth daily. What changed:  when to take this   aspirin 81 MG tablet Take 81 mg by mouth daily.   clopidogrel 75 MG tablet Commonly known as:  PLAVIX Take 1 tablet (75 mg total) by mouth daily.   simvastatin 40 MG tablet Commonly known as:  ZOCOR Take 1 tablet (40 mg total) by mouth daily at 6 PM. What changed:    medication strength  how much to take  how to take this  when to take this  additional instructions       Discharge Instructions: Please refer to Patient Instructions section of EMR for full details.  Patient was counseled important signs and symptoms that should prompt return to medical care, changes in medications, dietary instructions, activity restrictions, and follow up appointments.   Follow-Up  Appointments: Follow-up Information    Forrest Moron, MD. Schedule an appointment as soon as possible for a visit.   Specialty:  Internal Medicine Contact information: [place] [address] [phone]        Garvin Fila, MD Follow up in 8 week(s).   Specialties:  Neurology, Radiology Contact information: [address] [place] [address] [phone]          Rory Percy, [place] 12/21/2017, 2:41 PM PGY-2, [place]

## 2017-12-25 ENCOUNTER — Ambulatory Visit (HOSPITAL_COMMUNITY)
Admission: RE | Admit: 2017-12-25 | Discharge: 2017-12-25 | Disposition: A | Discharge status: Home / Self Care | Payer: Medicare Other | Source: Ambulatory Visit | Attending: Family Medicine | Admitting: Family Medicine

## 2017-12-25 DIAGNOSIS — I351 Nonrheumatic aortic (valve) insufficiency: Secondary | ICD-10-CM | POA: Diagnosis not present

## 2017-12-25 DIAGNOSIS — G458 Other transient cerebral ischemic attacks and related syndromes: Secondary | ICD-10-CM | POA: Diagnosis not present

## 2017-12-25 DIAGNOSIS — E785 Hyperlipidemia, unspecified: Secondary | ICD-10-CM | POA: Insufficient documentation

## 2017-12-25 DIAGNOSIS — I119 Hypertensive heart disease without heart failure: Secondary | ICD-10-CM | POA: Insufficient documentation

## 2017-12-25 NOTE — Progress Notes (Signed)
  Echocardiogram 2D Echocardiogram has been performed.  Deitrich Steve G Perle Gibbon 12/25/2017, 4:07 PM

## 2017-12-26 ENCOUNTER — Ambulatory Visit: Payer: Medicare Other | Admitting: Family Medicine

## 2017-12-26 DIAGNOSIS — I10 Essential (primary) hypertension: Secondary | ICD-10-CM | POA: Diagnosis not present

## 2017-12-26 DIAGNOSIS — I639 Cerebral infarction, unspecified: Secondary | ICD-10-CM | POA: Diagnosis not present

## 2017-12-26 DIAGNOSIS — E7800 Pure hypercholesterolemia, unspecified: Secondary | ICD-10-CM | POA: Diagnosis not present

## 2017-12-31 ENCOUNTER — Ambulatory Visit: Payer: Medicare Other | Attending: Family Medicine | Admitting: Occupational Therapy

## 2017-12-31 ENCOUNTER — Other Ambulatory Visit: Payer: Self-pay

## 2017-12-31 ENCOUNTER — Encounter: Payer: Self-pay | Admitting: Adult Health

## 2017-12-31 ENCOUNTER — Ambulatory Visit (INDEPENDENT_AMBULATORY_CARE_PROVIDER_SITE_OTHER): Payer: Medicare Other | Admitting: Adult Health

## 2017-12-31 VITALS — BP 120/75 | HR 57 | Ht 63.0 in | Wt 178.5 lb

## 2017-12-31 DIAGNOSIS — E7849 Other hyperlipidemia: Secondary | ICD-10-CM | POA: Diagnosis not present

## 2017-12-31 DIAGNOSIS — I6381 Other cerebral infarction due to occlusion or stenosis of small artery: Secondary | ICD-10-CM

## 2017-12-31 DIAGNOSIS — I639 Cerebral infarction, unspecified: Secondary | ICD-10-CM | POA: Diagnosis not present

## 2017-12-31 DIAGNOSIS — M6281 Muscle weakness (generalized): Secondary | ICD-10-CM | POA: Diagnosis not present

## 2017-12-31 DIAGNOSIS — I10 Essential (primary) hypertension: Secondary | ICD-10-CM

## 2017-12-31 MED ORDER — ASPIRIN 81 MG PO TABS: 81.0000 mg | ORAL_TABLET | Freq: Every day | ORAL | 0 refills | Status: AC

## 2017-12-31 MED ORDER — CLOPIDOGREL BISULFATE 75 MG PO TABS: 75.0000 mg | ORAL_TABLET | Freq: Every day | ORAL | 4 refills | Status: AC

## 2017-12-31 MED ORDER — SIMVASTATIN 40 MG PO TABS: 40.0000 mg | ORAL_TABLET | Freq: Every day | ORAL | 3 refills | Status: DC

## 2017-12-31 NOTE — Therapy (Signed)
[facility] [address] [place], [place], [address] Phone: [phone]   Fax:  [phone]  Occupational Therapy Evaluation  Patient Details  Name: Johnny Sheppard MRN: [id number] Date of Birth: 1950-08-28 No data recorded  Encounter Date: 12/31/2017  OT End of Session - 12/31/17 0934    Visit Number  1    Authorization Type  MCR    OT Start Time  0800    OT Stop Time  0845    OT Time Calculation (min)  45 min    Activity Tolerance  Patient tolerated treatment well    Behavior During Therapy  [facility] for tasks assessed/performed       Past Medical History:  Diagnosis Date  . Arthritis   . Asthma   . H/O hematuria    Followed by urology  . Hyperlipidemia   . Hypertension     Past Surgical History:  Procedure Laterality Date  . COLON SURGERY     2012, had 1 polyp, Dr Benson [place]    There were no vitals filed for this visit.  Subjective Assessment - 12/31/17 0810    Subjective   I'm doing everything I did before the stroke    Pertinent History  Rt thalamic infarct 12/20/17. PMH: HTN, HLD, OA    Currently in Pain?  No/denies        [facility] OT Assessment - 12/31/17 0001      Assessment   Medical Diagnosis  RT thalamic infarct     Onset Date/Surgical Date  12/20/17    Hand Dominance  Right    Next MD Visit  01/28/18      Precautions   Precaution Comments  no heavy exertion      Restrictions   Weight Bearing Restrictions  No      Balance Screen   Has the patient fallen in the past 6 months  No    Has the patient had a decrease in activity level because of a fear of falling?   No    Is the patient reluctant to leave their home because of a fear of falling?   No      Home  Environment   Astronomer    Additional Comments  Pt lives in 1 story home, 4 steps to enter.     Lives With  Spouse      Prior Function   Level of Independence  Independent    Vocation  Full time employment    Science writer   (more local driving, farthest driving is [place])     ADL   Eating/Feeding  Independent    Grooming  Independent    Environmental health practitioner  Independent      IADL   Shopping  Shops independently for small purchases    Light Housekeeping  --   wife did mostly   Meal Prep  --   wife did mostly   Investment banker, corporate own vehicle    Medication Management  Is responsible for taking medication in correct dosages at correct time    Psychiatrist financial matters independently (budgets, writes checks, pays rent, bills goes to [employer]), collects and keeps track of income  Mobility   Mobility Status  Independent      Written Expression   Dominant Hand  Right    Handwriting  --   denies change     Vision - History   Baseline Vision  Wears glasses for distance only      Activity Tolerance   Activity Tolerance Comments  denies changes      Cognition   Overall Cognitive Status  Within Functional Limits for tasks assessed      Sensation   Light Touch  Appears Intact    Additional Comments  slight tingling in fingertips which pt reports is getting better. Pt can easily id and localize touch      Coordination   Gross Motor Movements are Fluid and Coordinated  Yes    Fine Motor Movements are Fluid and Coordinated  Yes    Finger Nose Finger Test  intact (pt reports he was having difficulty initially w/ this test, but has resolved)    9 Hole Peg Test  Right;Left    Right 9 Hole Peg Test  21.59 sec    Left 9 Hole Peg Test  23.06 sec      Edema   Edema  none      Tone   Assessment Location  --   WNL's     ROM / Strength   AROM / PROM / Strength  AROM;Strength      AROM   Overall AROM Comments  BUE AROM WNL's      Strength   Overall  Strength Comments  BUE MMT grossly 5/5      Hand Function   Right Hand Grip (lbs)  100    Left Hand Grip (lbs)  120                      OT Education - 12/31/17 0934    Education Details  Warning signs and risk factors of CVA, discussed getting clearance from neurologist before returning to work    [employer]) Educated  Patient    Methods  Explanation;Handout    Comprehension  Verbalized understanding                 Plan - 12/31/17 0935    Clinical Impression Statement  Pt is a 67 y.o. male who presents to outpatient O.T. s/p Rt thalamic stroke 12/20/17. Pt was evaluated, and determined that he no longer needs O.T. Pt reports he is doing everything he was doing prior to stroke except working, however he is driving w/o difficulty.     Occupational Profile and client history currently impacting functional performance  PMH: HTN, HLD, DM, OA    OT Frequency  One time visit    Plan  No f/u O.T. warranted at this time    Consulted and Agree with Plan of Care  Patient       Patient will benefit from skilled therapeutic intervention in order to improve the following deficits and impairments:     Visit Diagnosis: Muscle weakness (generalized) - Plan: Ot plan of care cert/re-cert    Problem List Patient Active Problem List   Diagnosis Date Noted  . Thalamic infarct, acute ([place])   . Stroke ([place]) 12/20/2017  . H/O hematuria   . Elevated blood pressure 02/03/2016  . Hyperlipidemia 05/31/2012    Carey Bullocks, OTR/L 12/31/2017, 9:40 AM  [facility] [address] [place], [place], [address] Phone: [phone]   Fax:  [phone]  Name: Johnny Sheppard MRN: [id number] Date of Birth: 06/18/1950

## 2017-12-31 NOTE — Patient Instructions (Addendum)
Continue aspirin 81 mg daily and clopidogrel 75 mg daily  and simvastatin  for secondary stroke prevention  Continue both aspirin and plavix until 01/11/18 and stop aspirin and continue plavix only at that time  We will repeat MRA of your brain in 6 months - we will discuss ordering scan at follow up visit  Continue to follow up with PCP regarding cholesterol and blood pressure management   You are cleared to return back to work at this time as you have recovered well from recent stroke  Continue to monitor blood pressure at home  Maintain strict control of hypertension with blood pressure goal below 130/90, diabetes with hemoglobin A1c goal below 6.5% and cholesterol with LDL cholesterol (bad cholesterol) goal below 70 mg/dL. I also advised the patient to eat a healthy diet with plenty of whole grains, cereals, fruits and vegetables, exercise regularly and maintain ideal body weight.  Followup in the future with me in 6 months or call earlier if needed       Thank you for coming to see [place] at [facility]. I hope we have been able to provide you high quality care today.  You may receive a patient satisfaction survey over the next few weeks. We would appreciate your feedback and comments so that we may continue to improve ourselves and the health of our patients.

## 2017-12-31 NOTE — Progress Notes (Signed)
[facility] [address] [place]. [place] [address] [phone]       OFFICE FOLLOW UP NOTE  [name] Date of Birth:  02-04-1951 Medical Record Number:  [id number]   Reason for Referral:  hospital stroke follow up  CHIEF COMPLAINT:  Chief Complaint  Patient presents with  . Follow-up    Stroke follow up room 9 pt alone, pt had stroke  two weeks ago wants to return to work pt just showed up in office appt was available at this time, PT is full time, and    HPI: Johnny Sheppard is being seen today for initial visit in the office for right thalamic infarct on 12/20/17. History obtained from patient and chart review. Reviewed all radiology images and labs personally.  Johnny Sheppard a 67 y.o.malewith a PMH of HTN, HLD, previously diabetic with resolution of abnormal Hgb A1C, remote tobacco use, >10 yrs, asthma, presented to the ER with complaint ofleft face,tongue, hand numbness, tingling/paresthesias, sensory changes.Patient states thatat approximately 0730 this morning he was completing his ADL's and noticed that when he went to raise his hand to his mouth on the left side of his mouth, hefelt a tingling sensation. Patient states that he "assumed that asper cream accidentally got on his hand causing this sensation after touching his mouth." He also noticedsensory changes/ paraesthesias on his L hand, without weakness. Patient decidedon helping neighbor with yard work without notice of weakness. Symptom did not resolve,prompting patient to present to urgent care. Patient was sent to ER for a stroke work-up. CT head reviewed and was negative for acute,with mild cerebral atrophy andevidence ofchronic small vessel diseasenoted.MRI Brain reviewed and showed acute R thalamic infarct.  MRA head and neck was significant for a 2 mm right cavernous ICA aneurysm but negative for stenosis.  LDL 93 and recommended increase simvastatin dose to 40 mg daily.   Recommended aspirin and Plavix for 3 weeks and then Plavix alone as he was previously on aspirin 81 mg.  Due to minimal motor deficits and excellent functional status, patient was discharged with recommendations of outpatient PT and recommended outpatient echo and patient was discharged home in stable condition.  Patient is being seen today for hospital stroke follow-up and requested early appointment by patient to be released back to work.  He states he has very minimal numbness in his left fingertips and lips but this has been resolving.  He also states that his taste has been off but this is also been improving.  Patient did undergo echocardiogram on 12/25/2017 and showed an EF of 60 to 65% and negative for PFO.  He continues to take both aspirin and Plavix without side effects of bleeding or bruising.  Continues increased dose of simvastatin at 40 mg and denies side effects myalgias.  Patient has returned to doing all previous activities except for working.  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  He currently only does day trips with the longest run being down to [place].  He states if he is cleared by neurology and he will obtain DOT physical.  Blood pressure today satisfactory at 120/75.  It was requested during hospitalization for patient to undergo sleep apnea testing.  Patient states he does snore but otherwise sleeps well and denies daytime fatigue.  Denies being told about apneic events during the night by his wife.  Due to lack of symptoms for OSA, it was requested to hold off on sleep  study at this time and patient was advised to call with any concerns regarding possible symptoms of OSA.  Denies new or worsening stroke/TIA symptoms.    ROS:   14 system review of systems performed and negative with exception of snoring  PMH:  Past Medical History:  Diagnosis Date  . Arthritis   . Asthma   . H/O hematuria    Followed by urology  . Hyperlipidemia   . Hypertension      PSH:  Past Surgical History:  Procedure Laterality Date  . COLON SURGERY     2012, had 1 polyp, Dr Benson [place]    Social History:  Social History   Socioeconomic History  . Marital status: Married    Spouse name: Not on file  . Number of children: Not on file  . Years of education: Not on file  . Highest education level: Not on file  Occupational History  . Not on file  Social Needs  . Financial resource strain: Not on file  . Food insecurity:    Worry: Not on file    Inability: Not on file  . Transportation needs:    Medical: Not on file    Non-medical: Not on file  Tobacco Use  . Smoking status: Former Smoker    Packs/day: 1.00    Years: 40.00    Pack years: 40.00    Types: Cigarettes    Last attempt to quit: 05/20/1994    Years since quitting: 23.6  . Smokeless tobacco: Never Used  Substance and Sexual Activity  . Alcohol use: No    Alcohol/week: 0.0 standard drinks  . Drug use: No  . Sexual activity: Yes  Lifestyle  . Physical activity:    Days per week: Not on file    Minutes per session: Not on file  . Stress: Not on file  Relationships  . Social connections:    Talks on phone: Not on file    Gets together: Not on file    Attends religious service: Not on file    Active member of club or organization: Not on file    Attends meetings of clubs or organizations: Not on file    Relationship status: Not on file  . Intimate partner violence:    Fear of current or ex partner: Not on file    Emotionally abused: Not on file    Physically abused: Not on file    Forced sexual activity: Not on file  Other Topics Concern  . Not on file  Social History Narrative  . Not on file    Family History:  Family History  Problem Relation Age of Onset  . Hypertension Sister     Medications:   Current Outpatient Medications on File Prior to Visit  Medication Sig Dispense Refill  . acetaminophen (TYLENOL) 500 MG tablet Take 500-1,000 mg by mouth as needed for mild  pain.    Marland Kitchen amLODipine (NORVASC) 5 MG tablet Take 1 tablet (5 mg total) by mouth daily. (Patient taking differently: Take 5 mg by mouth every evening. ) 90 tablet 3  . Multiple Vitamins-Minerals (AIRBORNE PO) Take 1 tablet by mouth daily.     No current facility-administered medications on file prior to visit.     Allergies:   Allergies  Allergen Reactions  . Atorvastatin     Muscle/ skeletal pain      Physical Exam  Vitals:   12/31/17 1018  BP: 120/75  Pulse: (!) 57  Weight: 178 lb 8  oz (81 kg)  Height: 5\' 3"  (1.6 m)   Body mass index is 31.62 kg/m. No exam data present  General: well developed, well nourished, pleasant middle-aged male, seated, in no evident distress Head: head normocephalic and atraumatic.   Neck: supple with no carotid or supraclavicular bruits Cardiovascular: regular rate and rhythm, no murmurs Musculoskeletal: no deformity Skin:  no rash/petichiae Vascular:  Normal pulses all extremities  Neurologic Exam Mental Status: Awake and fully alert. Oriented to place and time. Recent and remote memory intact. Attention span, concentration and fund of knowledge appropriate. Mood and affect appropriate.  Cranial Nerves: Fundoscopic exam reveals sharp disc margins. Pupils equal, briskly reactive to light. Extraocular movements full without nystagmus. Visual fields full to confrontation. Hearing intact. Facial sensation intact. Face, tongue, palate moves normally and symmetrically.  Motor: Normal bulk and tone. Normal strength in all tested extremity muscles. Sensory.: intact to touch , pinprick , position and vibratory sensation.  Coordination: Rapid alternating movements normal in all extremities. Finger-to-nose and heel-to-shin performed accurately bilaterally. Gait and Station: Arises from chair without difficulty. Stance is normal. Gait demonstrates normal stride length and balance . Able to heel, toe and tandem walk without difficulty.  Reflexes: 1+ and  symmetric. Toes downgoing.    NIHSS  0 Modified Rankin  1   Diagnostic Data (Labs, Imaging, Testing)  CT head without contrast 12/20/2017 IMPRESSION: No acute intracranial abnormality. Mild cerebral atrophy and chronic small vessel disease.  MR brain without contrast 12/20/2017 IMPRESSION: Acute subcentimeter infarction in the lateral right thalamus. Moderate chronic small-vessel ischemic changes of the cerebral hemispheric white matter.  MR MRA neck/head without contrast 12/21/2017 IMPRESSION: 1. No large vessel occlusion. 2. Mild proximal right P2 stenosis. 3. 2 mm right cavernous ICA aneurysm. 4. Negative neck MRA.     ASSESSMENT: [name] D Elrod is a 67 y.o. year old male here with right thalamic infarct on 12/20/2017 secondary to small vessel disease. Vascular risk factors include HTN and HLD.    PLAN: -Continue aspirin 81 mg daily and clopidogrel 75 mg daily  and simvastatin for secondary stroke prevention -Continue DAPT until 01/11/2018 for a total of 3-weeks and at that time we will continue Plavix only -F/u with PCP regarding your HLD and HTN management -Repeat MRA in 6 months for aneurysm monitoring -Patient cleared to return back to work at this time as he is recovered well from a stroke standpoint with minimal deficits that will not affect working ability -continue to monitor BP at home -Advised to continue to stay active and maintain a healthy diet -Consider OSA testing in the future if needed -Maintain strict control of hypertension with blood pressure goal below 130/90, diabetes with hemoglobin A1c goal below 6.5% and cholesterol with LDL cholesterol (bad cholesterol) goal below 70 mg/dL. I also advised the patient to eat a healthy diet with plenty of whole grains, cereals, fruits and vegetables, exercise regularly and maintain ideal body weight.  Follow up in 6 months or call earlier if needed   Greater than 50% of time during this 25 minute visit was spent on  counseling,explanation of diagnosis of right thalamic infarct, reviewing risk factor management of HTN and HLD, planning of further management, discussion with patient and family and coordination of care    Venancio Poisson, [facility]  [facility] [address] [place], [place] [address]  Phone [phone] Fax [phone]

## 2018-01-01 NOTE — Progress Notes (Signed)
I agree with the above plan 

## 2018-01-05 ENCOUNTER — Ambulatory Visit: Payer: Medicare Other | Admitting: Occupational Therapy

## 2018-01-06 ENCOUNTER — Telehealth: Payer: Self-pay

## 2018-01-06 ENCOUNTER — Other Ambulatory Visit: Payer: Self-pay

## 2018-01-06 ENCOUNTER — Ambulatory Visit: Payer: Self-pay | Admitting: Physician Assistant

## 2018-01-06 ENCOUNTER — Encounter: Payer: Self-pay | Admitting: Physician Assistant

## 2018-01-06 VITALS — BP 122/76 | HR 74 | Temp 98.6°F | Resp 16 | Ht 63.0 in | Wt 178.8 lb

## 2018-01-06 DIAGNOSIS — Z0289 Encounter for other administrative examinations: Secondary | ICD-10-CM

## 2018-01-06 NOTE — Progress Notes (Signed)
Games developer Medical Examination   [name] Johnny Sheppard is a 67 y.o. male with a pertinent medical history of diet controlled diabetes, HTN, dyslipidemia   who presents today for a commercial driver fitness determination physical exam. The patient reports no problems today. In the past the patient reports receiving 1 year certificates. He denies focal neurological deficits, vision and hearing changes. He denies the habitual use of benzodiazepines, opioids, amphetamines and denies illicit drug use.   Current medications, family history, allergies, social history reviewed by me and exist elsewhere in the encounter.   Review of Systems  Constitutional: Negative for chills, diaphoresis and fever.  Eyes: Negative.   Respiratory: Negative for cough, hemoptysis, sputum production, shortness of breath and wheezing.   Cardiovascular: Negative for chest pain, orthopnea and leg swelling.  Gastrointestinal: Negative for abdominal pain, blood in stool, constipation, diarrhea, heartburn, melena, nausea and vomiting.  Genitourinary: Negative for dysuria, flank pain, frequency, hematuria and urgency.  Skin: Negative for rash.  Neurological: Negative for dizziness, sensory change, speech change, focal weakness and headaches.    Objective:     Vision/hearing:  Visual Acuity Screening   Right eye Left eye Both eyes  Without correction:     With correction: 20/40 20/15 20/13   Comments: 85 degrees peripheral   Hearing Screening Comments: 10 feet -whisper  Applicant can recognize and distinguish among traffic control signals and devices showing standard red, green, and amber colors.  Corrective lenses required: Yes  Monocular Vision?: No  Hearing aid requirement: No  Physical Exam  Constitutional: He is oriented to person, place, and time. He appears well-developed. He is active and cooperative.  Non-toxic appearance. He does not appear ill.  HENT:  Right Ear: Hearing, tympanic membrane,  external ear and ear canal normal.  Left Ear: Hearing, tympanic membrane, external ear and ear canal normal.  Nose: Nose normal. Right sinus exhibits no maxillary sinus tenderness and no frontal sinus tenderness. Left sinus exhibits no maxillary sinus tenderness and no frontal sinus tenderness.  Mouth/Throat: Uvula is midline, oropharynx is clear and moist and mucous membranes are normal. No oropharyngeal exudate, posterior oropharyngeal edema or tonsillar abscesses.  Eyes: Pupils are equal, round, and reactive to light. Conjunctivae and EOM are normal.  Cardiovascular: Normal rate, regular rhythm, S1 normal, S2 normal, normal heart sounds, intact distal pulses and normal pulses. Exam reveals no gallop and no friction rub.  No murmur heard. Pulmonary/Chest: Effort normal. No stridor. No tachypnea. No respiratory distress. He has no wheezes. He has no rales.  Abdominal: Soft. Normal appearance and bowel sounds are normal. He exhibits no distension and no mass. There is no tenderness. There is no rigidity, no rebound, no guarding and no CVA tenderness. No hernia.  Musculoskeletal: Normal range of motion. He exhibits no edema.  Lymphadenopathy:       Head (right side): No submandibular and no tonsillar adenopathy present.       Head (left side): No submandibular and no tonsillar adenopathy present.    He has no cervical adenopathy.  Neurological: He is alert and oriented to person, place, and time. He has normal strength and normal reflexes. He is not disoriented. No cranial nerve deficit or sensory deficit. He exhibits normal muscle tone. Coordination and gait normal.  Skin: Skin is warm and dry. He is not diaphoretic. No pallor.  Psychiatric: He has a normal mood and affect. His behavior is normal.  Nursing note and vitals reviewed.   BP 122/76   Pulse 74  Temp 98.6 F (37 C)   Resp 16   Ht 5\' 3"  (1.6 m)   Wt 178 lb 12.8 oz (81.1 kg)   SpO2 97%   BMI 31.67 kg/m   Labs:     Assessment:    Healthy male exam.  Meets standards, but periodic monitoring required due to well controlled HTN.  Driver qualified only for 1 year.    Plan:    Medical examiners certificate completed and printed. Return as needed.    Philis Fendt, MS, PA-C 3:42 PM, 01/06/2018

## 2018-01-06 NOTE — Telephone Encounter (Signed)
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

## 2018-01-06 NOTE — Patient Instructions (Signed)
° ° ° °  If you have lab work done today you will be contacted with your lab results within the next 2 weeks.  If you have not heard from us then please contact us. The fastest way to get your results is to register for My Chart. ° ° °IF you received an x-ray today, you will receive an invoice from [facility]. Please contact [facility] at [phone] with questions or concerns regarding your invoice.  ° °IF you received labwork today, you will receive an invoice from [facility]. Please contact [facility] at [phone] with questions or concerns regarding your invoice.  ° °Our billing staff will not be able to assist you with questions regarding bills from these companies. ° °You will be contacted with the lab results as soon as they are available. The fastest way to get your results is to activate your My Chart account. Instructions are located on the last page of this paperwork. If you have not heard from us regarding the results in 2 weeks, please contact this office. °  ° ° ° °

## 2018-01-28 ENCOUNTER — Ambulatory Visit: Payer: Medicare Other | Admitting: Adult Health

## 2018-03-24 DIAGNOSIS — I10 Essential (primary) hypertension: Secondary | ICD-10-CM | POA: Diagnosis not present

## 2018-03-24 DIAGNOSIS — E7800 Pure hypercholesterolemia, unspecified: Secondary | ICD-10-CM | POA: Diagnosis not present

## 2018-03-28 ENCOUNTER — Other Ambulatory Visit: Payer: Self-pay | Admitting: Physician Assistant

## 2018-03-28 DIAGNOSIS — I10 Essential (primary) hypertension: Secondary | ICD-10-CM

## 2018-04-02 DIAGNOSIS — I639 Cerebral infarction, unspecified: Secondary | ICD-10-CM | POA: Diagnosis not present

## 2018-04-02 DIAGNOSIS — Z23 Encounter for immunization: Secondary | ICD-10-CM | POA: Diagnosis not present

## 2018-04-02 DIAGNOSIS — R7309 Other abnormal glucose: Secondary | ICD-10-CM | POA: Diagnosis not present

## 2018-04-02 DIAGNOSIS — I10 Essential (primary) hypertension: Secondary | ICD-10-CM | POA: Diagnosis not present

## 2018-07-06 ENCOUNTER — Encounter: Payer: Self-pay | Admitting: Adult Health

## 2018-07-06 ENCOUNTER — Ambulatory Visit (INDEPENDENT_AMBULATORY_CARE_PROVIDER_SITE_OTHER): Payer: Medicare Other | Admitting: Adult Health

## 2018-07-06 ENCOUNTER — Telehealth: Payer: Self-pay | Admitting: Adult Health

## 2018-07-06 VITALS — BP 110/62 | HR 65 | Ht 63.0 in | Wt 180.8 lb

## 2018-07-06 DIAGNOSIS — I671 Cerebral aneurysm, nonruptured: Secondary | ICD-10-CM

## 2018-07-06 DIAGNOSIS — I639 Cerebral infarction, unspecified: Secondary | ICD-10-CM

## 2018-07-06 DIAGNOSIS — E7849 Other hyperlipidemia: Secondary | ICD-10-CM

## 2018-07-06 DIAGNOSIS — I6381 Other cerebral infarction due to occlusion or stenosis of small artery: Secondary | ICD-10-CM

## 2018-07-06 DIAGNOSIS — R7303 Prediabetes: Secondary | ICD-10-CM

## 2018-07-06 DIAGNOSIS — I10 Essential (primary) hypertension: Secondary | ICD-10-CM

## 2018-07-06 NOTE — Telephone Encounter (Signed)
Medicare order sent to GI. No auth they will reach out to the pt to schedule.  °

## 2018-07-06 NOTE — Progress Notes (Signed)
I agree with the above plan 

## 2018-07-06 NOTE — Progress Notes (Signed)
[facility] [address] [place]. [place] [address] [phone]       OFFICE FOLLOW UP NOTE  [name] Date of Birth:  [birth year] Medical Record Number:  [id number]   Reason for Referral:  hospital stroke follow up  CHIEF COMPLAINT:  Chief Complaint  Patient presents with  . Follow-up    6 month follow up. Wife present. Rm 9. No new concerns at this time.     HPI: 07/06/18 VISIT  [name] is a 68 year old male who returns today for routine follow-up visit after right thalamic infarct in 12/2017 and is accompanied by his wife.  He continues to do well from a stroke standpoint with residual deficits of occasional numbness in left hand finger tips and lips.  He has returned back to work full-time without difficulties or complications.  He does endorse intermittent "tightness" sensation in his right occipital area without complaints of radiating pain into neck or up into head.  This sensation can last for couple minutes and typically occurs 4-5 times monthly.  This is been present since his stroke and denies any worsening.  He is questioning whether this could be a clot or blockage in 1 of his arteries and is fearful of a potential new stroke.  He does endorse history of cervical pain and has been told he has arthritis.  He continues on Plavix without side effects of bleeding or bruising.  Continues on atorvastatin without side effects myalgias.  Blood pressure today satisfactory at 110/62 but he does monitor at home and typically SBP 130s.  Denies new or worsening stroke/TIA symptoms.   HISTORY SUMMARY INITIAL VISIT 12/31/2017: Johnny Sheppard is being seen today for initial visit in the office for right thalamic infarct on 12/20/17. History obtained from patient and chart review. Reviewed all radiology images and labs personally.  [name] D Jonesis a 68 y.o.malewith a PMH of HTN, HLD, previously diabetic with resolution of abnormal Hgb A1C, remote tobacco use,  >10 yrs, asthma, presented to the ER with complaint ofleft face,tongue, hand numbness, tingling/paresthesias, sensory changes.Patient states thatat approximately 0730 this morning he was completing his ADL's and noticed that when he went to raise his hand to his mouth on the left side of his mouth, hefelt a tingling sensation. Patient states that he "assumed that asper cream accidentally got on his hand causing this sensation after touching his mouth." He also noticedsensory changes/ paraesthesias on his L hand, without weakness. Patient decidedon helping neighbor with yard work without notice of weakness. Symptom did not resolve,prompting patient to present to urgent care. Patient was sent to ER for a stroke work-up. CT head reviewed and was negative for acute,with mild cerebral atrophy andevidence ofchronic small vessel diseasenoted.MRI Brain reviewed and showed acute R thalamic infarct.  MRA head and neck was significant for a 2 mm right cavernous ICA aneurysm but negative for stenosis.  LDL 93 and recommended increase simvastatin dose to 40 mg daily.  Recommended aspirin and Plavix for 3 weeks and then Plavix alone as he was previously on aspirin 81 mg.  Due to minimal motor deficits and excellent functional status, patient was discharged with recommendations of outpatient PT and recommended outpatient echo and patient was discharged home in stable condition.  Patient is being seen today for hospital stroke follow-up and requested early appointment by patient to be released back to work.  He states he has very minimal numbness in his left fingertips and lips but this has been resolving.  He also states that his taste has been off but this is also been improving.  Patient did undergo echocardiogram on 12/25/2017 and showed an EF of 60 to 65% and negative for PFO.  He continues to take both aspirin and Plavix without side effects of bleeding or bruising.  Continues increased dose of simvastatin at 40  mg and denies side effects myalgias.  Patient has returned to doing all previous activities except for working.  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  He currently only does day trips with the longest run being down to [place].  He states if he is cleared by neurology and he will obtain DOT physical.  Blood pressure today satisfactory at 120/75.  It was requested during hospitalization for patient to undergo sleep apnea testing.  Patient states he does snore but otherwise sleeps well and denies daytime fatigue.  Denies being told about apneic events during the night by his wife.  Due to lack of symptoms for OSA, it was requested to hold off on sleep study at this time and patient was advised to call with any concerns regarding possible symptoms of OSA.  Denies new or worsening stroke/TIA symptoms.    ROS:   14 system review of systems performed and negative with exception of see HPI  PMH:  Past Medical History:  Diagnosis Date  . Arthritis   . Asthma   . H/O hematuria    Followed by urology  . Hyperlipidemia   . Hypertension     PSH:  Past Surgical History:  Procedure Laterality Date  . COLON SURGERY     2012, had 1 polyp, Dr Benson [place]    Social History:  Social History   Socioeconomic History  . Marital status: Married    Spouse name: Not on file  . Number of children: Not on file  . Years of education: Not on file  . Highest education level: Not on file  Occupational History  . Not on file  Social Needs  . Financial resource strain: Not on file  . Food insecurity:    Worry: Not on file    Inability: Not on file  . Transportation needs:    Medical: Not on file    Non-medical: Not on file  Tobacco Use  . Smoking status: Former Smoker    Packs/day: 1.00    Years: 40.00    Pack years: 40.00    Types: Cigarettes    Last attempt to quit: 05/20/1994    Years since quitting: 24.1  . Smokeless tobacco: Never Used  Substance and Sexual Activity  .  Alcohol use: No    Alcohol/week: 0.0 standard drinks  . Drug use: No  . Sexual activity: Yes  Lifestyle  . Physical activity:    Days per week: Not on file    Minutes per session: Not on file  . Stress: Not on file  Relationships  . Social connections:    Talks on phone: Not on file    Gets together: Not on file    Attends religious service: Not on file    Active member of club or organization: Not on file    Attends meetings of clubs or organizations: Not on file    Relationship status: Not on file  . Intimate partner violence:    Fear of current or ex partner: Not on file    Emotionally abused: Not on file    Physically abused: Not on file  Forced sexual activity: Not on file  Other Topics Concern  . Not on file  Social History Narrative  . Not on file    Family History:  Family History  Problem Relation Age of Onset  . Hypertension Sister     Medications:   Current Outpatient Medications on File Prior to Visit  Medication Sig Dispense Refill  . acetaminophen (TYLENOL) 500 MG tablet Take 500-1,000 mg by mouth as needed for mild pain.    Marland Kitchen amLODipine (NORVASC) 10 MG tablet Take 10 mg by mouth daily.    . clopidogrel (PLAVIX) 75 MG tablet Take 1 tablet (75 mg total) by mouth daily. 90 tablet 4  . Multiple Vitamins-Minerals (AIRBORNE PO) Take 1 tablet by mouth daily.    . simvastatin (ZOCOR) 40 MG tablet Take 1 tablet (40 mg total) by mouth daily at 6 PM. 90 tablet 3   No current facility-administered medications on file prior to visit.     Allergies:   Allergies  Allergen Reactions  . Atorvastatin     Muscle/ skeletal pain      Physical Exam  Vitals:   07/06/18 0951  BP: 110/62  Pulse: 65  Weight: 180 lb 12.8 oz (82 kg)  Height: 5\' 3"  (1.6 m)   Body mass index is 32.03 kg/m. No exam data present  General: well developed, well nourished, pleasant middle-aged male, seated, in no evident distress Head: head normocephalic and atraumatic.   Neck:  supple with no carotid or supraclavicular bruits Cardiovascular: regular rate and rhythm, no murmurs Musculoskeletal: no deformity; full neck ROM Skin:  no rash/petichiae Vascular:  Normal pulses all extremities  Neurologic Exam Mental Status: Awake and fully alert. Oriented to place and time. Recent and remote memory intact. Attention span, concentration and fund of knowledge appropriate. Mood and affect appropriate.  Cranial Nerves: Pupils equal, briskly reactive to light. Extraocular movements full without nystagmus. Visual fields full to confrontation. Hearing intact. Facial sensation intact. Face, tongue, palate moves normally and symmetrically.  Denies pain with occipital pressure Motor: Normal bulk and tone. Normal strength in all tested extremity muscles. Sensory.: intact to touch , pinprick , position and vibratory sensation.  Coordination: Rapid alternating movements normal in all extremities. Finger-to-nose and heel-to-shin performed accurately bilaterally. Gait and Station: Arises from chair without difficulty. Stance is normal. Gait demonstrates normal stride length and balance . Able to heel, toe and tandem walk without difficulty.  Reflexes: 1+ and symmetric. Toes downgoing.       Diagnostic Data (Labs, Imaging, Testing)  CT head without contrast 12/20/2017 IMPRESSION: No acute intracranial abnormality. Mild cerebral atrophy and chronic small vessel disease.  MR brain without contrast 12/20/2017 IMPRESSION: Acute subcentimeter infarction in the lateral right thalamus. Moderate chronic small-vessel ischemic changes of the cerebral hemispheric white matter.  MR MRA neck/head without contrast 12/21/2017 IMPRESSION: 1. No large vessel occlusion. 2. Mild proximal right P2 stenosis. 3. 2 mm right cavernous ICA aneurysm. 4. Negative neck MRA.     ASSESSMENT: Johnny and [name] D Tesch is a 68 y.o. year old male here with right thalamic infarct on 12/20/2017 secondary to small  vessel disease. Vascular risk factors include HTN and HLD.  He is being seen today for follow-up visit with continued residual deficits of left finger and lip numbness but otherwise stable from a stroke standpoint   PLAN: -Continue clopidogrel 75 mg daily  and simvastatin for secondary stroke prevention -As lipid panel not obtained recently, we will obtain lipid panel, A1c and CMP (assess kidney  function prior to CTA head/neck) -CTA head/neck for routine monitoring of ICA aneurysm -if stable, will repeat in 1 years time -Ensured patient that his occipital region intermittent pain is most likely due to muscle tension when driving for long periods of time and likely bad posture.  Prior imaging of spine does not show evidence of concern and does not have complaints of radiating symptoms.  No indication for any imaging at this time and advised to follow-up with PCP if continues -F/u with PCP regarding your HLD and HTN management -continue to monitor BP at home -Advised to continue to stay active and maintain a healthy diet -Continue denial of OSA testing and advised to call office in the future if interested -Maintain strict control of hypertension with blood pressure goal below 130/90, diabetes with hemoglobin A1c goal below 6.5% and cholesterol with LDL cholesterol (bad cholesterol) goal below 70 mg/dL. I also advised the patient to eat a healthy diet with plenty of whole grains, cereals, fruits and vegetables, exercise regularly and maintain ideal body weight.  Follow up in 6 months or call earlier if needed   Greater than 50% of time during this 25 minute visit was spent on counseling,explanation of diagnosis of right thalamic infarct, reviewing risk factor management of HTN and HLD, planning of further management, discussion with patient and family and coordination of care    Venancio Poisson, [facility]  [facility] [address] [place], [place]  [address]  Phone [phone] Fax [phone]

## 2018-07-06 NOTE — Patient Instructions (Signed)
You will be called to schedule CT angio of your head and neck to assess for previously found aneurysm   We will check cholesterol level, A1c and kidney function today - we will call if anything medication adjustments need to be made  Continue clopidogrel 75 mg daily  and Zocor  for secondary stroke prevention  Continue to follow up with PCP regarding cholesterol and blood pressure management   Continue to monitor blood pressure at home  Maintain strict control of hypertension with blood pressure goal below 130/90, diabetes with hemoglobin A1c goal below 6.5% and cholesterol with LDL cholesterol (bad cholesterol) goal below 70 mg/dL. I also advised the patient to eat a healthy diet with plenty of whole grains, cereals, fruits and vegetables, exercise regularly and maintain ideal body weight.  Followup in the future with me in 6 months or call earlier if needed       Thank you for coming to see [place] at [facility]. I hope we have been able to provide you high quality care today.  You may receive a patient satisfaction survey over the next few weeks. We would appreciate your feedback and comments so that we may continue to improve ourselves and the health of our patients.

## 2018-07-07 LAB — COMPREHENSIVE METABOLIC PANEL
ALBUMIN: 4.4 g/dL (ref 3.8–4.8)
ALT: 19 IU/L (ref 0–44)
AST: 19 IU/L (ref 0–40)
Albumin/Globulin Ratio: 1.6 (ref 1.2–2.2)
Alkaline Phosphatase: 83 IU/L (ref 39–117)
BILIRUBIN TOTAL: 0.4 mg/dL (ref 0.0–1.2)
BUN / CREAT RATIO: 12 (ref 10–24)
BUN: 12 mg/dL (ref 8–27)
CHLORIDE: 104 mmol/L (ref 96–106)
CO2: 22 mmol/L (ref 20–29)
Calcium: 9.5 mg/dL (ref 8.6–10.2)
Creatinine, Ser: 1.01 mg/dL (ref 0.76–1.27)
GFR calc Af Amer: 89 mL/min/{1.73_m2} (ref >59)
GFR calc non Af Amer: 77 mL/min/{1.73_m2} (ref >59)
GLOBULIN, TOTAL: 2.8 g/dL (ref 1.5–4.5)
GLUCOSE: 103 mg/dL — AB (ref 65–99)
Potassium: 4.9 mmol/L (ref 3.5–5.2)
SODIUM: 140 mmol/L (ref 134–144)
Total Protein: 7.2 g/dL (ref 6.0–8.5)

## 2018-07-07 LAB — LIPID PANEL
CHOLESTEROL TOTAL: 140 mg/dL (ref 100–199)
Chol/HDL Ratio: 2.9 ratio (ref 0.0–5.0)
HDL: 48 mg/dL (ref >39)
LDL Calculated: 78 mg/dL (ref 0–99)
TRIGLYCERIDES: 71 mg/dL (ref 0–149)
VLDL Cholesterol Cal: 14 mg/dL (ref 5–40)

## 2018-07-07 LAB — HEMOGLOBIN A1C
ESTIMATED AVERAGE GLUCOSE: 128 mg/dL
Hgb A1c MFr Bld: 6.1 % — ABNORMAL HIGH (ref 4.8–5.6)

## 2018-07-08 ENCOUNTER — Telehealth: Payer: Self-pay

## 2018-07-08 NOTE — Telephone Encounter (Signed)
I called patient about his lab work results. I stated his kidney function cholesterol levels and A1c are stable. Continue to current regimen by maintaining a healthy diet low in carbohydrates and cholesterol. PT verbalized understanding. ------

## 2018-07-08 NOTE — Telephone Encounter (Signed)
-----   Message from Venancio Poisson, NP sent at 07/07/2018  4:03 PM EST ----- Please advise patient that his recent blood work showed satisfactory kidney function, satisfactory cholesterol levels and stable A1c.  Please advised to continue current regimen maintaining a healthy diet low in cholesterol and carbohydrates

## 2018-07-16 ENCOUNTER — Ambulatory Visit
Admission: RE | Admit: 2018-07-16 | Discharge: 2018-07-16 | Disposition: A | Discharge status: Home / Self Care | Payer: Medicare Other | Source: Ambulatory Visit | Attending: Adult Health | Admitting: Adult Health

## 2018-07-16 DIAGNOSIS — I671 Cerebral aneurysm, nonruptured: Secondary | ICD-10-CM | POA: Diagnosis not present

## 2018-07-16 MED ORDER — IOPAMIDOL (ISOVUE-370) INJECTION 76%
75.0000 mL | Freq: Once | INTRAVENOUS | Status: AC | PRN
  Administered 2018-07-16: 75 mL via INTRAVENOUS

## 2018-07-22 ENCOUNTER — Telehealth: Payer: Self-pay

## 2018-07-22 NOTE — Telephone Encounter (Signed)
-----   Message from Garvin Fila, MD sent at 07/21/2018  6:54 PM EST ----- Kindly inform the patient that CT angiogram shows no definite aneurysm or any worrisome finding.  Mild changes of tortuosity of the carotid artery on the right before it enters the brain is unchanged from previous MRA.  No new findings

## 2018-07-22 NOTE — Telephone Encounter (Signed)
Notes recorded by Marval Regal, RN on 07/22/2018 at 10:08 AM EST I called patient Kindly iCT angiogram shows no definite aneurysm or any worrisome finding. Mild changes of tortuosity of the carotid artery on the right before it enters the brain is unchanged from previous MRA. No new findings.Pt verbalized understanding. ------

## 2018-09-25 DIAGNOSIS — R7309 Other abnormal glucose: Secondary | ICD-10-CM | POA: Diagnosis not present

## 2018-09-25 DIAGNOSIS — Z125 Encounter for screening for malignant neoplasm of prostate: Secondary | ICD-10-CM | POA: Diagnosis not present

## 2018-09-25 DIAGNOSIS — I639 Cerebral infarction, unspecified: Secondary | ICD-10-CM | POA: Diagnosis not present

## 2018-09-25 DIAGNOSIS — I10 Essential (primary) hypertension: Secondary | ICD-10-CM | POA: Diagnosis not present

## 2018-10-01 DIAGNOSIS — Z7189 Other specified counseling: Secondary | ICD-10-CM | POA: Diagnosis not present

## 2018-10-01 DIAGNOSIS — I639 Cerebral infarction, unspecified: Secondary | ICD-10-CM | POA: Diagnosis not present

## 2018-10-01 DIAGNOSIS — W19XXXA Unspecified fall, initial encounter: Secondary | ICD-10-CM | POA: Diagnosis not present

## 2018-10-01 DIAGNOSIS — I10 Essential (primary) hypertension: Secondary | ICD-10-CM | POA: Diagnosis not present

## 2018-10-01 DIAGNOSIS — R7303 Prediabetes: Secondary | ICD-10-CM | POA: Diagnosis not present

## 2018-10-01 DIAGNOSIS — M7989 Other specified soft tissue disorders: Secondary | ICD-10-CM | POA: Diagnosis not present

## 2018-10-01 DIAGNOSIS — E7800 Pure hypercholesterolemia, unspecified: Secondary | ICD-10-CM | POA: Diagnosis not present

## 2018-10-19 DIAGNOSIS — Z20828 Contact with and (suspected) exposure to other viral communicable diseases: Secondary | ICD-10-CM | POA: Diagnosis not present

## 2018-12-30 ENCOUNTER — Ambulatory Visit: Payer: Self-pay | Admitting: Emergency Medicine

## 2018-12-30 ENCOUNTER — Encounter: Payer: Self-pay | Admitting: Emergency Medicine

## 2018-12-30 ENCOUNTER — Other Ambulatory Visit: Payer: Self-pay

## 2018-12-30 VITALS — BP 123/74 | HR 68 | Temp 98.3°F | Resp 16 | Ht 63.0 in | Wt 177.4 lb

## 2018-12-30 DIAGNOSIS — Z024 Encounter for examination for driving license: Secondary | ICD-10-CM

## 2018-12-30 NOTE — Progress Notes (Signed)
BP Readings from Last 3 Encounters:  12/30/18 123/74  07/06/18 110/62  01/06/18 122/76       This patient presents for DOT examination for fitness for duty.   Medical History:  1. Head/Brain Injuries, disorders or illnesses no  2. Seizures, epilepsy no  3. Eye disorders or impaired vision (except corrective lenses) no  4. Ear disorders, loss of hearing or balance no  5. Heart disease or heart attack, other cardiovascular condition no  6. Heart surgery (valve replacement/bypass, angioplasty, pacemaker/defribrillator) no  7. High blood pressure yes  8. High cholesterol yes  9. Chronic cough, shortness of breath or other breathing problems no  10. Lung disease (emphysema, asthma or chronic bronchitis) no  11. Kidney disease, dialysis no  12. Digestive problems  no  13. Diabetes or elevated blood sugar no                      If yes to #13, Insulin use n/a  14. Nervious or psychiatric disorders, e.g., severe depression no  15. Fainting or syncope no  16. Dizziness, headaches, numbness, tingling or memory loss no  17. Unexplained weight loss no  18. Stroke, TIA or paralysis yes  19. Missing or impaired hand, arm, foot, leg, finger, toe no  20. Spinal injury or disease no  21. Bone, muscles or nerve problems no  22. Blood clots or bleeding bleeding disorders no  23. Cancer no  24. Chronic infection or other chronic diseases no  25. Sleep disorders, pauses in breathing while asleep, daytime sleepiness, loud snoring no  26. Have you ever had a sleep test? no  27.  Have you ever spent a night in the hospital? yes  28. Have you ever had a broken bone? no  29. Have you or or do you use tobacco products? yes  30. Regular, frequent alcohol use no  31. Illegal substance use within the past 2 years no  32.  Have you ever failed a drug test or been dependent on an illegal substance? no   Current Medications: Prior to Admission medications   Medication Sig Start Date End Date Taking?  Authorizing Provider  acetaminophen (TYLENOL) 500 MG tablet Take 500-1,000 mg by mouth as needed for mild pain.   Yes [provider]  amLODipine (NORVASC) 10 MG tablet Take 10 mg by mouth daily. 04/26/18  Yes [provider]  clopidogrel (PLAVIX) 75 MG tablet Take 1 tablet (75 mg total) by mouth daily. 12/31/17 12/31/18 Yes McCrue, Janett Billow, NP  Multiple Vitamins-Minerals (AIRBORNE PO) Take 1 tablet by mouth daily.   Yes [provider]  simvastatin (ZOCOR) 40 MG tablet Take 1 tablet (40 mg total) by mouth daily at 6 PM. 12/31/17  Yes McCrue, Janett Billow, NP    Medical Examiner's Comments on Health History:  Well-controlled and stable chronic medical problems. In good general medical condition.  TESTING:  No exam data present  Monocular Vision: No.  Hearing Aid used for test: No. Hearing Aid required to to meet standard: No.  BP 123/74   Pulse 68   Temp 98.3 F (36.8 C) (Oral)   Resp 16   Ht 5\' 3"  (1.6 m) Comment: PER PATIENT  Wt 177 lb 6.4 oz (80.5 kg)   SpO2 97%   BMI 31.42 kg/m  Pulse rate is regular  Comments: u/a: protein-trace, blood-negative, spg-1.030, glucose-negative  PHYSICAL EXAMINATION:  General Appearance Not markedly obese. No tremor, signs of alcoholism, problem drinking or drug abuse.  Skin Warm, dry and intact.  Eyes Pupils are equal, round and reactive to light and accommodation, extraocular movements are intact. No exophthalmos, no nystagmus.  Ears Normal external ears. External canal without occlusion. No scarring of the TM. No perforation of the TM.  Mouth and Throat Clear and moist. No irremedial deformities likely to interfere with breathing or swallowing.  Heart No murmurs, extra sounds, evidence of cardiomegaly. No pacemaker. No implantable defibrillator.  Lungs and Chest (excluding breasts) Normal chest expansion, respiratory rate, breath sounds. No cyanosis.  Abdomen and Viscera No liver enlargement. No splenic enlargement.  No masses, bruits, hernias or significant abdominal wall weakness.  Genitourinary  No inguinal or femoral hernia.  Spine and other musculoskeletal No tenderness, no limitation of motion, no deformities. No evidence of previous surgery.  Extremities No loss or impairment of leg, foot, toe, arm, hand, finger. No perceptible limp, deformities, atrophy, weakness, paralysis, clubbing, edema, hypotonia. Patient has sufficient grasp and prehension to maintain steering wheel grip. Patient has sufficient mobility and strength in the lower limbs to operate pedals properly.  Neurologic Normal equilibrium, coordination, speech pattern. No paresthesia, asymmetry of deep tendon reflexes, sensory or positional abnormalities. No abnormality of patellar or Babinski's reflexes.  Gait Not antalgic or ataxic  Vascular Normal pulses. No carotid or arterial bruits. No varicose veins.     Certification Status: Meets standards, but periodic monitoring required due to: HTN  Driver qualified only for: 1 year   Certification expires 12/30/2019.

## 2018-12-30 NOTE — Patient Instructions (Signed)
Health Maintenance After Age 67 After age 67, you are at a higher risk for certain long-term diseases and infections as well as injuries from falls. Falls are a major cause of broken bones and head injuries in people who are older than age 67. Getting regular preventive care can help to keep you healthy and well. Preventive care includes getting regular testing and making lifestyle changes as recommended by your health care provider. Talk with your health care provider about:  Which screenings and tests you should have. A screening is a test that checks for a disease when you have no symptoms.  A diet and exercise plan that is right for you. What should I know about screenings and tests to prevent falls? Screening and testing are the best ways to find a health problem early. Early diagnosis and treatment give you the best chance of managing medical conditions that are common after age 67. Certain conditions and lifestyle choices may make you more likely to have a fall. Your health care provider may recommend:  Regular vision checks. Poor vision and conditions such as cataracts can make you more likely to have a fall. If you wear glasses, make sure to get your prescription updated if your vision changes.  Medicine review. Work with your health care provider to regularly review all of the medicines you are taking, including over-the-counter medicines. Ask your health care provider about any side effects that may make you more likely to have a fall. Tell your health care provider if any medicines that you take make you feel dizzy or sleepy.  Osteoporosis screening. Osteoporosis is a condition that causes the bones to get weaker. This can make the bones weak and cause them to break more easily.  Blood pressure screening. Blood pressure changes and medicines to control blood pressure can make you feel dizzy.  Strength and balance checks. Your health care provider may recommend certain tests to check your  strength and balance while standing, walking, or changing positions.  Foot health exam. Foot pain and numbness, as well as not wearing proper footwear, can make you more likely to have a fall.  Depression screening. You may be more likely to have a fall if you have a fear of falling, feel emotionally low, or feel unable to do activities that you used to do.  Alcohol use screening. Using too much alcohol can affect your balance and may make you more likely to have a fall. What actions can I take to lower my risk of falls? General instructions  Talk with your health care provider about your risks for falling. Tell your health care provider if: ? You fall. Be sure to tell your health care provider about all falls, even ones that seem minor. ? You feel dizzy, sleepy, or off-balance.  Take over-the-counter and prescription medicines only as told by your health care provider. These include any supplements.  Eat a healthy diet and maintain a healthy weight. A healthy diet includes low-fat dairy products, low-fat (lean) meats, and fiber from whole grains, beans, and lots of fruits and vegetables. Home safety  Remove any tripping hazards, such as rugs, cords, and clutter.  Install safety equipment such as grab bars in bathrooms and safety rails on stairs.  Keep rooms and walkways well-lit. Activity   Follow a regular exercise program to stay fit. This will help you maintain your balance. Ask your health care provider what types of exercise are appropriate for you.  If you need a cane or   walker, use it as recommended by your health care provider.  Wear supportive shoes that have nonskid soles. Lifestyle  Do not drink alcohol if your health care provider tells you not to drink.  If you drink alcohol, limit how much you have: ? 0-1 drink a day for women. ? 0-2 drinks a day for men.  Be aware of how much alcohol is in your drink. In the [place] one drink equals one typical bottle of beer (12  oz), one-half glass of wine (5 oz), or one shot of hard liquor (1 oz).  Do not use any products that contain nicotine or tobacco, such as cigarettes and e-cigarettes. If you need help quitting, ask your health care provider. Summary  Having a healthy lifestyle and getting preventive care can help to protect your health and wellness after age 67.  Screening and testing are the best way to find a health problem early and help you avoid having a fall. Early diagnosis and treatment give you the best chance for managing medical conditions that are more common for people who are older than age 67.  Falls are a major cause of broken bones and head injuries in people who are older than age 67. Take precautions to prevent a fall at home.  Work with your health care provider to learn what changes you can make to improve your health and wellness and to prevent falls. This information is not intended to replace advice given to you by your health care provider. Make sure you discuss any questions you have with your health care provider. Document Released: 03/19/2017 Document Revised: 08/27/2018 Document Reviewed: 03/19/2017 Elsevier Patient Education  2020 Elsevier Inc.  

## 2019-01-05 ENCOUNTER — Other Ambulatory Visit: Payer: Self-pay

## 2019-01-05 ENCOUNTER — Encounter: Payer: Self-pay | Admitting: Adult Health

## 2019-01-05 ENCOUNTER — Ambulatory Visit (INDEPENDENT_AMBULATORY_CARE_PROVIDER_SITE_OTHER): Payer: Medicare Other | Admitting: Adult Health

## 2019-01-05 VITALS — BP 118/71 | HR 64 | Temp 98.2°F | Ht 63.0 in | Wt 177.2 lb

## 2019-01-05 DIAGNOSIS — I639 Cerebral infarction, unspecified: Secondary | ICD-10-CM | POA: Diagnosis not present

## 2019-01-05 DIAGNOSIS — I671 Cerebral aneurysm, nonruptured: Secondary | ICD-10-CM | POA: Diagnosis not present

## 2019-01-05 DIAGNOSIS — E7849 Other hyperlipidemia: Secondary | ICD-10-CM

## 2019-01-05 DIAGNOSIS — I10 Essential (primary) hypertension: Secondary | ICD-10-CM | POA: Diagnosis not present

## 2019-01-05 DIAGNOSIS — I6381 Other cerebral infarction due to occlusion or stenosis of small artery: Secondary | ICD-10-CM

## 2019-01-05 NOTE — Progress Notes (Signed)
[facility] [address] [place] [address] [phone]       OFFICE FOLLOW UP NOTE  Mr. [name] D Yurchak Date of Birth:  07-Jun-1950 Medical Record Number:  [id number]   Reason for Referral:  hospital stroke follow up  CHIEF COMPLAINT:  Chief Complaint  Patient presents with   Follow-up    6 mon f/u. Alone. Rm 9. No new concerns at this time.     HPI: 01/05/19 update: Mr. Joslyn is a 68 year old male who is being seen today for stroke follow-up.  He has been doing well from a neurological standpoint with residual deficits of intermittent mild left lip numbness.  He continues to work without difficulty.  Blood pressure today 118/71 and does monitor daily at home with typical levels 120/70s.  He continues on Plavix and atorvastatin for secondary stroke prevention without side effects.  Blood work obtained at prior visit with satisfactory lipid panel and A1c level. Surveillance monitoring of right ICA aneurysm stable as evidenced on CTA on 07/16/2018.  No further concerns at this time.    07/06/2018 update: Mr. Roselle is a 68 year old male who returns today for routine follow-up visit after right thalamic infarct in 12/2017 and is accompanied by his wife.  He continues to do well from a stroke standpoint with residual deficits of occasional numbness in left hand finger tips and lips.  He has returned back to work full-time without difficulties or complications.  He does endorse intermittent "tightness" sensation in his right occipital area without complaints of radiating pain into neck or up into head.  This sensation can last for couple minutes and typically occurs 4-5 times monthly.  This is been present since his stroke and denies any worsening.  He is questioning whether this could be a clot or blockage in 1 of his arteries and is fearful of a potential new stroke.  He does endorse history of cervical pain and has been told he has arthritis.  He continues on Plavix  without side effects of bleeding or bruising.  Continues on atorvastatin without side effects myalgias.  Blood pressure today satisfactory at 110/62 but he does monitor at home and typically SBP 130s.  Denies new or worsening stroke/TIA symptoms.   HISTORY SUMMARY INITIAL VISIT 12/31/2017: [name] Ravan is being seen today for initial visit in the office for right thalamic infarct on 12/20/17. History obtained from patient and chart review. Reviewed all radiology images and labs personally.  [name] D Jonesis a 68 y.o.malewith a PMH of HTN, HLD, previously diabetic with resolution of abnormal Hgb A1C, remote tobacco use, >10 yrs, asthma, presented to the ER with complaint ofleft face,tongue, hand numbness, tingling/paresthesias, sensory changes.Patient states thatat approximately 0730 this morning he was completing his ADL's and noticed that when he went to raise his hand to his mouth on the left side of his mouth, hefelt a tingling sensation. Patient states that he "assumed that asper cream accidentally got on his hand causing this sensation after touching his mouth." He also noticedsensory changes/ paraesthesias on his L hand, without weakness. Patient decidedon helping neighbor with yard work without notice of weakness. Symptom did not resolve,prompting patient to present to urgent care. Patient was sent to ER for a stroke work-up. CT head reviewed and was negative for acute,with mild cerebral atrophy andevidence ofchronic small vessel diseasenoted.MRI Brain reviewed and showed acute R thalamic infarct.  MRA head and neck was significant for a 2 mm right cavernous ICA aneurysm but negative for  stenosis.  LDL 93 and recommended increase simvastatin dose to 40 mg daily.  Recommended aspirin and Plavix for 3 weeks and then Plavix alone as he was previously on aspirin 81 mg.  Due to minimal motor deficits and excellent functional status, patient was discharged with recommendations of outpatient  PT and recommended outpatient echo and patient was discharged home in stable condition.  Patient is being seen today for hospital stroke follow-up and requested early appointment by patient to be released back to work.  He states he has very minimal numbness in his left fingertips and lips but this has been resolving.  He also states that his taste has been off but this is also been improving.  Patient did undergo echocardiogram on 12/25/2017 and showed an EF of 60 to 65% and negative for PFO.  He continues to take both aspirin and Plavix without side effects of bleeding or bruising.  Continues increased dose of simvastatin at 40 mg and denies side effects myalgias.  Patient has returned to doing all previous activities except for working.  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  He currently only does day trips with the longest run being down to [place].  He states if he is cleared by neurology and he will obtain DOT physical.  Blood pressure today satisfactory at 120/75.  It was requested during hospitalization for patient to undergo sleep apnea testing.  Patient states he does snore but otherwise sleeps well and denies daytime fatigue.  Denies being told about apneic events during the night by his wife.  Due to lack of symptoms for OSA, it was requested to hold off on sleep study at this time and patient was advised to call with any concerns regarding possible symptoms of OSA.  Denies new or worsening stroke/TIA symptoms.    ROS:   14 system review of systems performed and negative with exception of see HPI  PMH:  Past Medical History:  Diagnosis Date   Arthritis    Asthma    H/O hematuria    Followed by urology   Hyperlipidemia    Hypertension     PSH:  Past Surgical History:  Procedure Laterality Date   COLON SURGERY     2012, had 1 polyp, Dr Benson [place]    Social History:  Social History   Socioeconomic History   Marital status: Married    Spouse name: Not  on file   Number of children: Not on file   Years of education: Not on file   Highest education level: Not on file  Occupational History   Not on file  Social Needs   Financial resource strain: Not on file   Food insecurity    Worry: Not on file    Inability: Not on file   Transportation needs    Medical: Not on file    Non-medical: Not on file  Tobacco Use   Smoking status: Former Smoker    Packs/day: 1.00    Years: 40.00    Pack years: 40.00    Types: Cigarettes    Quit date: 05/20/1994    Years since quitting: 24.6   Smokeless tobacco: Never Used  Substance and Sexual Activity   Alcohol use: No    Alcohol/week: 0.0 standard drinks   Drug use: No   Sexual activity: Yes  Lifestyle   Physical activity    Days per week: Not on file    Minutes per session: Not on file  Stress: Not on file  Relationships   Social connections    Talks on phone: Not on file    Gets together: Not on file    Attends religious service: Not on file    Active member of club or organization: Not on file    Attends meetings of clubs or organizations: Not on file    Relationship status: Not on file   Intimate partner violence    Fear of current or ex partner: Not on file    Emotionally abused: Not on file    Physically abused: Not on file    Forced sexual activity: Not on file  Other Topics Concern   Not on file  Social History Narrative   Not on file    Family History:  Family History  Problem Relation Age of Onset   Hypertension Sister     Medications:   Current Outpatient Medications on File Prior to Visit  Medication Sig Dispense Refill   acetaminophen (TYLENOL) 500 MG tablet Take 500-1,000 mg by mouth as needed for mild pain.     amLODipine (NORVASC) 10 MG tablet Take 10 mg by mouth daily.     clopidogrel (PLAVIX) 75 MG tablet Take 75 mg by mouth daily.     Multiple Vitamins-Minerals (AIRBORNE PO) Take 1 tablet by mouth daily.     simvastatin (ZOCOR) 40  MG tablet Take 1 tablet (40 mg total) by mouth daily at 6 PM. 90 tablet 3   No current facility-administered medications on file prior to visit.     Allergies:   Allergies  Allergen Reactions   Atorvastatin     Muscle/ skeletal pain      Physical Exam  Vitals:   01/05/19 1534  BP: 118/71  Pulse: 64  Temp: 98.2 F (36.8 C)  TempSrc: Oral  Weight: 177 lb 3.2 oz (80.4 kg)  Height: 5\' 3"  (1.6 m)   Body mass index is 31.39 kg/m. No exam data present  General: well developed, well nourished, pleasant middle-aged male, seated, in no evident distress Head: head normocephalic and atraumatic.   Neck: supple with no carotid or supraclavicular bruits Cardiovascular: regular rate and rhythm, no murmurs Musculoskeletal: no deformity; full neck ROM Skin:  no rash/petichiae Vascular:  Normal pulses all extremities  Neurologic Exam Mental Status: Awake and fully alert. Oriented to place and time. Recent and remote memory intact. Attention span, concentration and fund of knowledge appropriate. Mood and affect appropriate.  Cranial Nerves: Pupils equal, briskly reactive to light. Extraocular movements full without nystagmus. Visual fields full to confrontation. Hearing intact. Facial sensation intact. Face, tongue, palate moves normally and symmetrically.  Motor: Normal bulk and tone. Normal strength in all tested extremity muscles. Sensory.: intact to touch , pinprick , position and vibratory sensation.  Coordination: Rapid alternating movements normal in all extremities. Finger-to-nose and heel-to-shin performed accurately bilaterally. Gait and Station: Arises from chair without difficulty. Stance is normal. Gait demonstrates normal stride length and balance . Able to heel, toe and tandem walk without difficulty.  Reflexes: 1+ and symmetric. Toes downgoing.       Diagnostic Data (Labs, Imaging, Testing)  CT ANGIO HEAD  CT ANGIO NECK 07/16/2018 IMPRESSION: 2 mm lateral out  pouching of the right cavernous carotid consistent with a minimal atherosclerotic aneurysm. This is unlikely to be or become clinically significant.  Minimal atherosclerotic change of the aortic arch. No other atherosclerotic changes. Specifically, both carotid bifurcations are widely patent without visible soft or calcified plaque. Cervical internal  carotid arteries are tortuous but widely patent.  Prominent anterior cervical osteophytes, suggesting diffuse idiopathic skeletal hyperostosis. These can be associated with dysphagia.  Lipid Panel     Component Value Date/Time   CHOL 140 07/06/2018 1045   TRIG 71 07/06/2018 1045   HDL 48 07/06/2018 1045   CHOLHDL 2.9 07/06/2018 1045   CHOLHDL 3.9 12/21/2017 0551   VLDL 19 12/21/2017 0551   LDLCALC 78 07/06/2018 1045       ASSESSMENT: [name] D Chandran is a 68 y.o. year old male here with right thalamic infarct on 12/20/2017 secondary to small vessel disease. Vascular risk factors include HTN and HLD.  Imaging also showed right ICA aneurysm with recent surveillance monitoring stable without changes.  Residual deficits of intermittent left lip numbness but otherwise recovered well from a stroke standpoint.   PLAN: -Continue clopidogrel 75 mg daily  and simvastatin for secondary stroke prevention -Repeat surveillance monitoring for right ICA aneurysm in 6 months -request PCP monitor ongoing -F/u with PCP regarding your HLD and HTN management  -continue to monitor BP at home -Advised to continue to stay active and maintain a healthy diet -Maintain strict control of hypertension with blood pressure goal below 130/90, diabetes with hemoglobin A1c goal below 6.5% and cholesterol with LDL cholesterol (bad cholesterol) goal below 70 mg/dL. I also advised the patient to eat a healthy diet with plenty of whole grains, cereals, fruits and vegetables, exercise regularly and maintain ideal body weight.  Stable from a stroke standpoint and  recommend follow-up as needed.  If PCP unable to monitor stable ICA aneurysm ongoing, he will need follow-up visit in 6 months   Greater than 50% of time during this 25 minute visit was spent on counseling,explanation of diagnosis of right thalamic infarct, reviewing risk factor management of HTN and HLD, planning of further management, discussion with patient and family and coordination of care    Venancio Poisson, [facility]  [facility] [address] [place], [place] [address]  Phone [phone] Fax [phone]

## 2019-01-05 NOTE — Progress Notes (Signed)
I agree with the above plan 

## 2019-01-05 NOTE — Patient Instructions (Signed)
Continue clopidogrel 75 mg daily  and simvastatin for secondary stroke prevention  Continue to follow up with PCP regarding cholesterol, blood pressure and diabetes management   Continue to stay active and maintain a healthy diet  Continue to monitor blood pressure at home  Maintain strict control of hypertension with blood pressure goal below 130/90, diabetes with hemoglobin A1c goal below 6.5% and cholesterol with LDL cholesterol (bad cholesterol) goal below 70 mg/dL. I also advised the patient to eat a healthy diet with plenty of whole grains, cereals, fruits and vegetables, exercise regularly and maintain ideal body weight.  Follow-up as needed has recovered well from a stroke standpoint with routine follow-up by PCP.  It will be recommended to repeat imaging for right carotid aneurysm in approximately 6 months.  If your PCP is unable to monitor ongoing, please call office to schedule follow-up visit within the next 6 months       Thank you for coming to see [place] at [facility]. I hope we have been able to provide you high quality care today.  You may receive a patient satisfaction survey over the next few weeks. We would appreciate your feedback and comments so that we may continue to improve ourselves and the health of our patients.

## 2019-01-19 ENCOUNTER — Ambulatory Visit: Payer: Medicare Other | Admitting: Emergency Medicine

## 2019-01-27 DIAGNOSIS — I10 Essential (primary) hypertension: Secondary | ICD-10-CM | POA: Diagnosis not present

## 2019-01-27 DIAGNOSIS — R7303 Prediabetes: Secondary | ICD-10-CM | POA: Diagnosis not present

## 2019-01-27 DIAGNOSIS — E7800 Pure hypercholesterolemia, unspecified: Secondary | ICD-10-CM | POA: Diagnosis not present

## 2019-02-01 DIAGNOSIS — Z23 Encounter for immunization: Secondary | ICD-10-CM | POA: Diagnosis not present

## 2019-02-01 DIAGNOSIS — I10 Essential (primary) hypertension: Secondary | ICD-10-CM | POA: Diagnosis not present

## 2019-02-01 DIAGNOSIS — E7800 Pure hypercholesterolemia, unspecified: Secondary | ICD-10-CM | POA: Diagnosis not present

## 2019-02-01 DIAGNOSIS — I639 Cerebral infarction, unspecified: Secondary | ICD-10-CM | POA: Diagnosis not present

## 2019-02-01 DIAGNOSIS — R7309 Other abnormal glucose: Secondary | ICD-10-CM | POA: Diagnosis not present

## 2019-02-01 DIAGNOSIS — Z0000 Encounter for general adult medical examination without abnormal findings: Secondary | ICD-10-CM | POA: Diagnosis not present

## 2019-03-09 ENCOUNTER — Other Ambulatory Visit: Payer: Self-pay | Admitting: Adult Health

## 2019-03-09 DIAGNOSIS — E7849 Other hyperlipidemia: Secondary | ICD-10-CM

## 2019-03-27 ENCOUNTER — Other Ambulatory Visit: Payer: Self-pay | Admitting: Adult Health

## 2019-03-27 DIAGNOSIS — E7849 Other hyperlipidemia: Secondary | ICD-10-CM

## 2019-07-26 DIAGNOSIS — E7800 Pure hypercholesterolemia, unspecified: Secondary | ICD-10-CM | POA: Diagnosis not present

## 2019-07-26 DIAGNOSIS — R7309 Other abnormal glucose: Secondary | ICD-10-CM | POA: Diagnosis not present

## 2019-07-26 DIAGNOSIS — I10 Essential (primary) hypertension: Secondary | ICD-10-CM | POA: Diagnosis not present

## 2019-08-02 DIAGNOSIS — M25512 Pain in left shoulder: Secondary | ICD-10-CM | POA: Diagnosis not present

## 2019-08-02 DIAGNOSIS — Z125 Encounter for screening for malignant neoplasm of prostate: Secondary | ICD-10-CM | POA: Diagnosis not present

## 2019-08-02 DIAGNOSIS — Z8673 Personal history of transient ischemic attack (TIA), and cerebral infarction without residual deficits: Secondary | ICD-10-CM | POA: Diagnosis not present

## 2019-08-02 DIAGNOSIS — R7989 Other specified abnormal findings of blood chemistry: Secondary | ICD-10-CM | POA: Diagnosis not present

## 2019-08-02 DIAGNOSIS — I10 Essential (primary) hypertension: Secondary | ICD-10-CM | POA: Diagnosis not present

## 2019-08-02 DIAGNOSIS — R8290 Unspecified abnormal findings in urine: Secondary | ICD-10-CM | POA: Diagnosis not present

## 2019-08-02 DIAGNOSIS — R7309 Other abnormal glucose: Secondary | ICD-10-CM | POA: Diagnosis not present

## 2019-08-11 DIAGNOSIS — M519 Unspecified thoracic, thoracolumbar and lumbosacral intervertebral disc disorder: Secondary | ICD-10-CM | POA: Diagnosis not present

## 2019-08-11 DIAGNOSIS — M5136 Other intervertebral disc degeneration, lumbar region: Secondary | ICD-10-CM | POA: Diagnosis not present

## 2019-08-11 DIAGNOSIS — M542 Cervicalgia: Secondary | ICD-10-CM | POA: Diagnosis not present

## 2019-08-11 DIAGNOSIS — M25512 Pain in left shoulder: Secondary | ICD-10-CM | POA: Diagnosis not present

## 2019-08-11 DIAGNOSIS — M4810 Ankylosing hyperostosis [Forestier], site unspecified: Secondary | ICD-10-CM | POA: Diagnosis not present

## 2019-08-31 DIAGNOSIS — M545 Low back pain: Secondary | ICD-10-CM | POA: Diagnosis not present

## 2019-08-31 DIAGNOSIS — M25512 Pain in left shoulder: Secondary | ICD-10-CM | POA: Diagnosis not present

## 2019-09-13 IMAGING — CT CT HEAD W/O CM
4 series · 17 of 47 positions shown, 19 images · non-contrast
Comparison: None.

CLINICAL DATA: Left facial and arm numbness and slurred speech for
several hours. Focal neuro deficit, < 6 hrs, stroke suspected

EXAM:
CT HEAD WITHOUT CONTRAST
TECHNIQUE: Contiguous axial images were obtained from the base of the skull
through the vertex without intravenous contrast.

[Series 3: head without · axial · non-contrast · 0.48mm/px · z∈[-115,+15]mm · 7 of 36 slices shown, 9 images]
[im 5/36  brain]
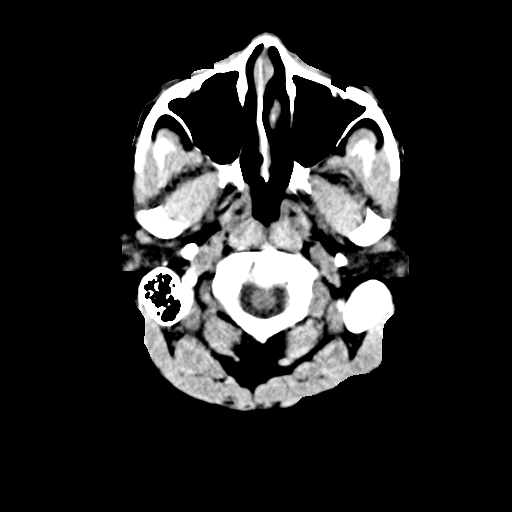
[im 5/36  bone]
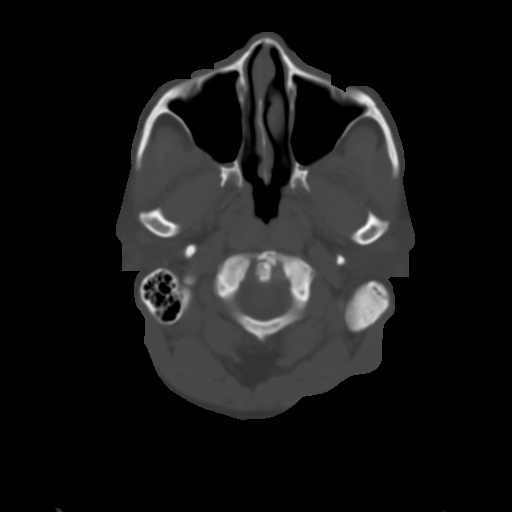
[im 9/36  brain]
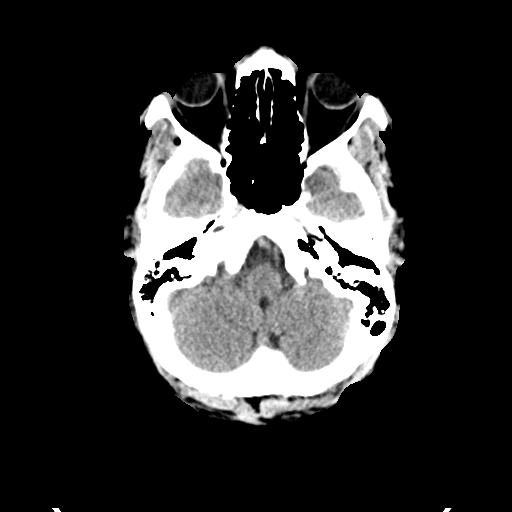
[im 14/36  brain]
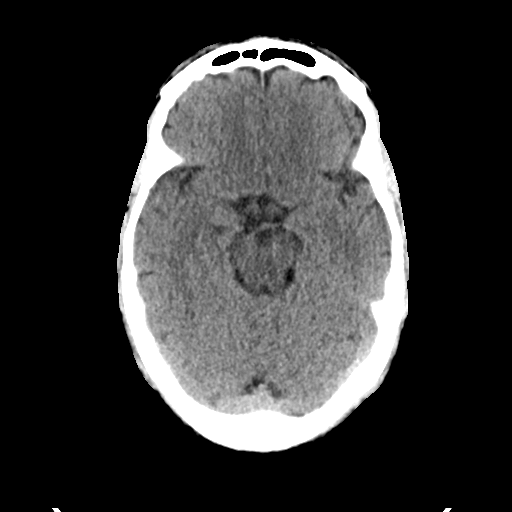
[im 18/36  brain]
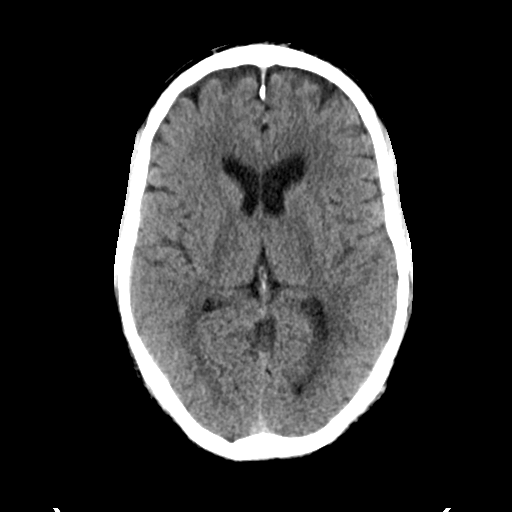
[im 22/36  brain]
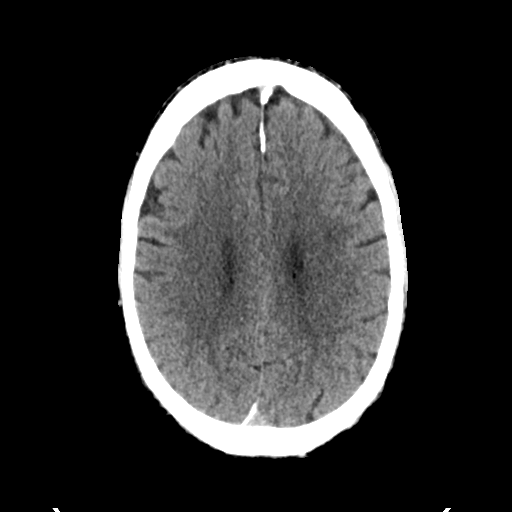
[im 22/36  bone]
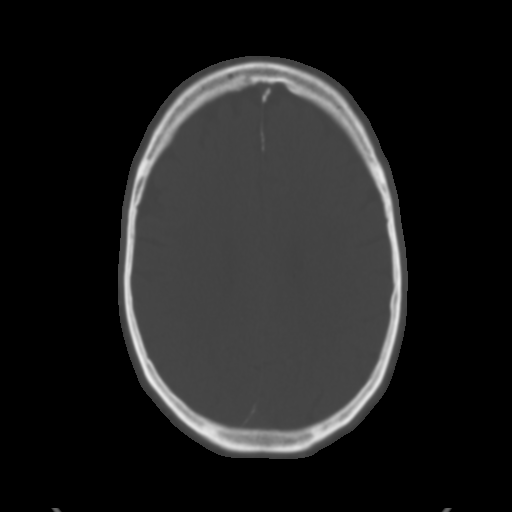
[im 27/36  brain]
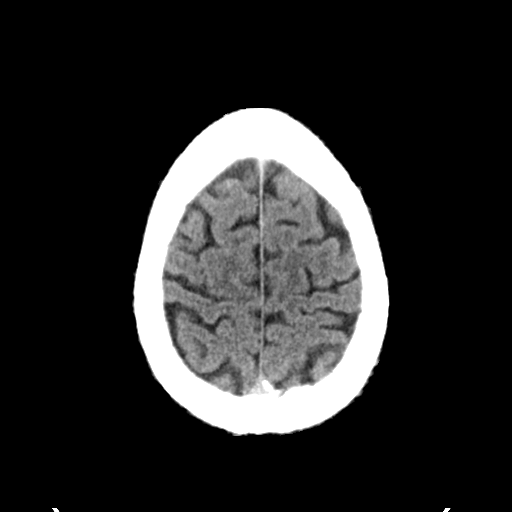
[im 31/36  brain]
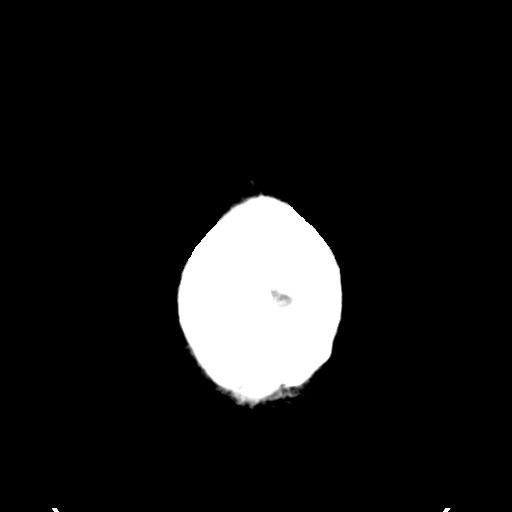

[Series 4: head bone · axial · 0.48mm/px · z∈[-119,-57]mm · 4 of 89 slices shown]
[im 9/89  bone]
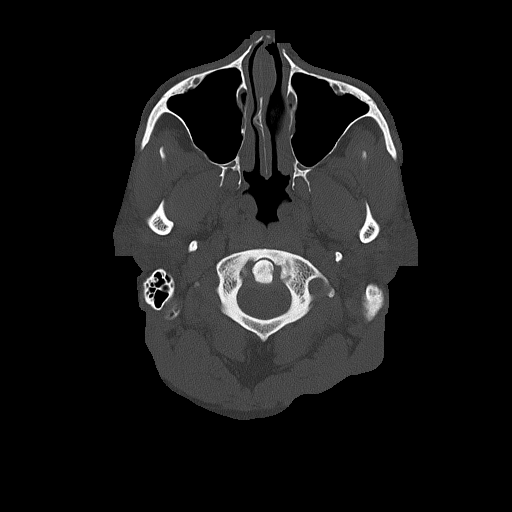
[im 18/89  bone]
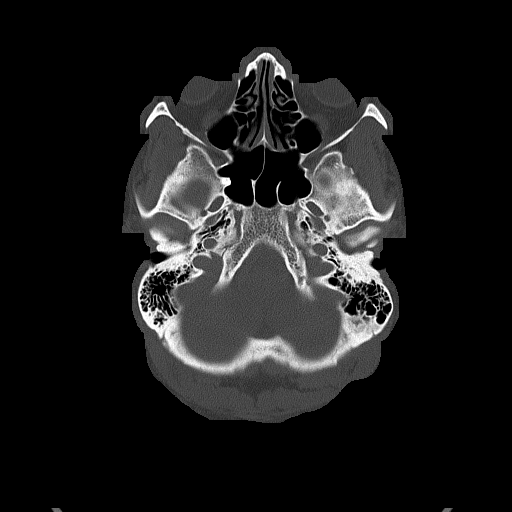
[im 27/89  bone]
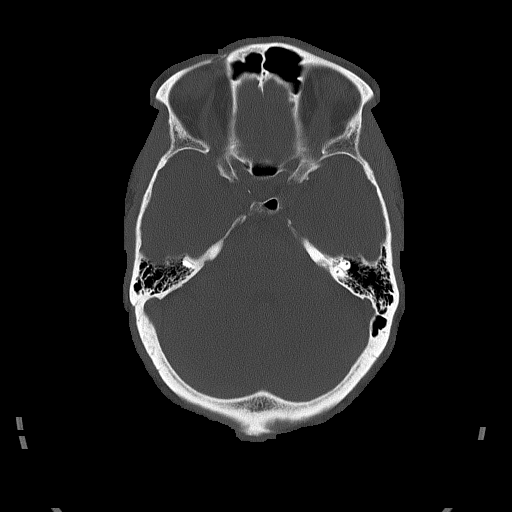
[im 40/89  bone]
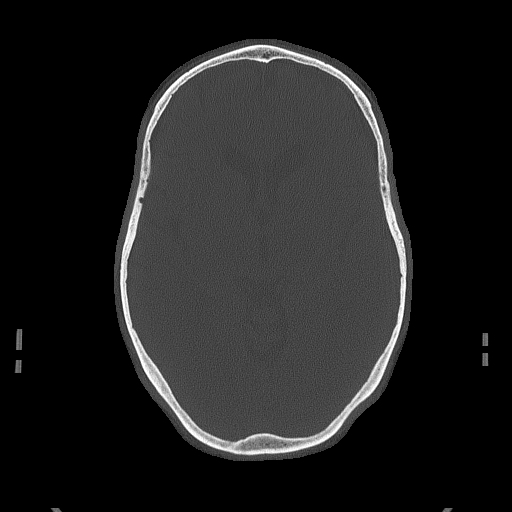

[Series 5: head without cor · coronal · non-contrast · 0.36mm/px · 3 of 78 slices shown]
[im 26/78  brain]
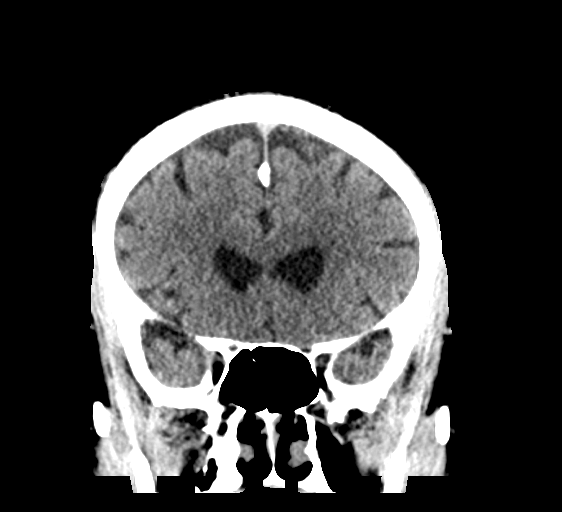
[im 35/78  brain]
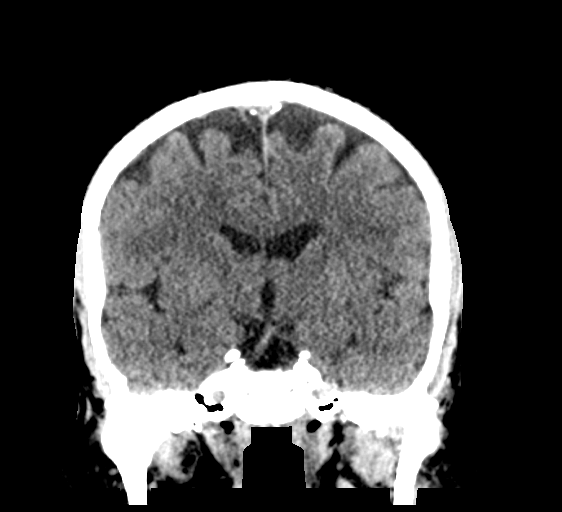
[im 43/78  brain]
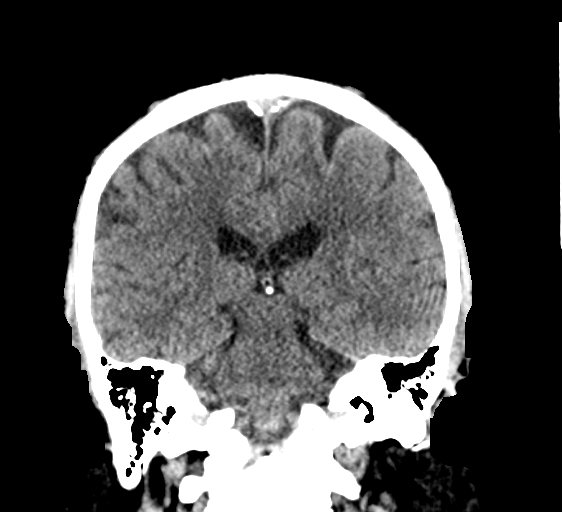

[Series 6: head without sag · sagittal · non-contrast · 0.39mm/px · 3 of 67 slices shown]
[im 23/67  brain]
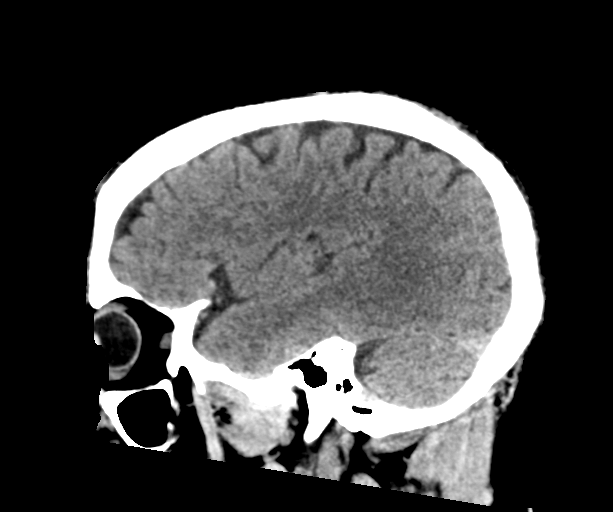
[im 34/67  brain]
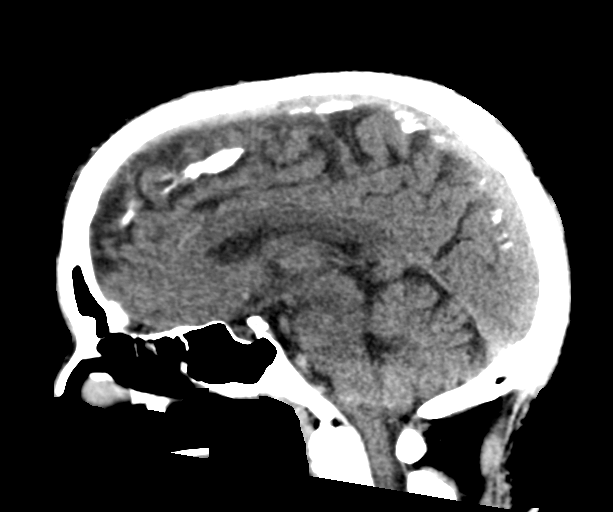
[im 45/67  brain]
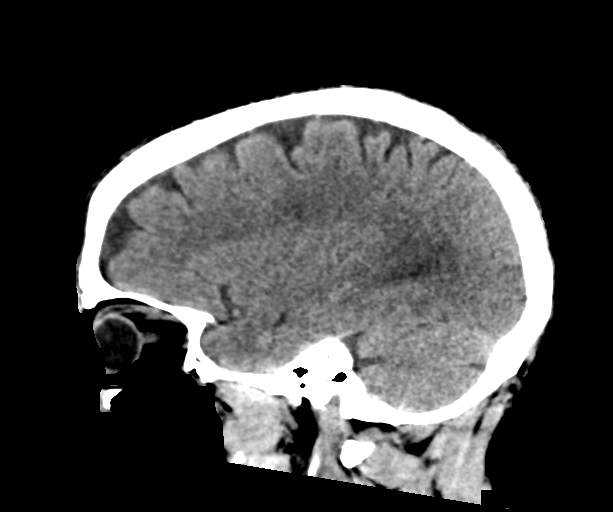

[17 of 47 positions shown; findings below may reference images not displayed]

FINDINGS: Brain: No evidence of acute infarction, hemorrhage, hydrocephalus,
extra-axial collection, or mass lesion/mass effect. Mild cerebral
atrophy and chronic small vessel disease.

Vascular:  No hyperdense vessel or other acute findings.

Skull: No evidence of fracture or other significant bone
abnormality.

Sinuses/Orbits:  No acute findings.

Other: None.
IMPRESSION: No acute intracranial abnormality. Mild cerebral atrophy and chronic
small vessel disease.

## 2019-09-14 DIAGNOSIS — M545 Low back pain: Secondary | ICD-10-CM | POA: Diagnosis not present

## 2020-01-31 DIAGNOSIS — R7309 Other abnormal glucose: Secondary | ICD-10-CM | POA: Diagnosis not present

## 2020-01-31 DIAGNOSIS — Z8673 Personal history of transient ischemic attack (TIA), and cerebral infarction without residual deficits: Secondary | ICD-10-CM | POA: Diagnosis not present

## 2020-01-31 DIAGNOSIS — E7800 Pure hypercholesterolemia, unspecified: Secondary | ICD-10-CM | POA: Diagnosis not present

## 2020-01-31 DIAGNOSIS — R7989 Other specified abnormal findings of blood chemistry: Secondary | ICD-10-CM | POA: Diagnosis not present

## 2020-01-31 DIAGNOSIS — Z125 Encounter for screening for malignant neoplasm of prostate: Secondary | ICD-10-CM | POA: Diagnosis not present

## 2020-01-31 DIAGNOSIS — I10 Essential (primary) hypertension: Secondary | ICD-10-CM | POA: Diagnosis not present

## 2020-02-09 DIAGNOSIS — R0683 Snoring: Secondary | ICD-10-CM | POA: Diagnosis not present

## 2020-02-09 DIAGNOSIS — I10 Essential (primary) hypertension: Secondary | ICD-10-CM | POA: Diagnosis not present

## 2020-02-09 DIAGNOSIS — G4719 Other hypersomnia: Secondary | ICD-10-CM | POA: Diagnosis not present

## 2020-02-09 DIAGNOSIS — E7800 Pure hypercholesterolemia, unspecified: Secondary | ICD-10-CM | POA: Diagnosis not present

## 2020-02-09 DIAGNOSIS — Z23 Encounter for immunization: Secondary | ICD-10-CM | POA: Diagnosis not present

## 2020-02-09 DIAGNOSIS — Z8673 Personal history of transient ischemic attack (TIA), and cerebral infarction without residual deficits: Secondary | ICD-10-CM | POA: Diagnosis not present

## 2020-02-09 DIAGNOSIS — R7303 Prediabetes: Secondary | ICD-10-CM | POA: Diagnosis not present

## 2020-02-09 DIAGNOSIS — R0681 Apnea, not elsewhere classified: Secondary | ICD-10-CM | POA: Diagnosis not present

## 2020-02-09 DIAGNOSIS — Z0000 Encounter for general adult medical examination without abnormal findings: Secondary | ICD-10-CM | POA: Diagnosis not present

## 2020-04-10 DIAGNOSIS — N5201 Erectile dysfunction due to arterial insufficiency: Secondary | ICD-10-CM | POA: Diagnosis not present

## 2020-04-10 DIAGNOSIS — Z125 Encounter for screening for malignant neoplasm of prostate: Secondary | ICD-10-CM | POA: Diagnosis not present

## 2020-04-10 DIAGNOSIS — R3129 Other microscopic hematuria: Secondary | ICD-10-CM | POA: Diagnosis not present

## 2020-04-10 DIAGNOSIS — R3915 Urgency of urination: Secondary | ICD-10-CM | POA: Diagnosis not present

## 2020-05-02 DIAGNOSIS — Z23 Encounter for immunization: Secondary | ICD-10-CM | POA: Diagnosis not present

## 2020-05-05 DIAGNOSIS — Z03818 Encounter for observation for suspected exposure to other biological agents ruled out: Secondary | ICD-10-CM | POA: Diagnosis not present

## 2020-08-01 DIAGNOSIS — R8290 Unspecified abnormal findings in urine: Secondary | ICD-10-CM | POA: Diagnosis not present

## 2020-08-01 DIAGNOSIS — R7989 Other specified abnormal findings of blood chemistry: Secondary | ICD-10-CM | POA: Diagnosis not present

## 2020-08-01 DIAGNOSIS — R7309 Other abnormal glucose: Secondary | ICD-10-CM | POA: Diagnosis not present

## 2020-08-01 DIAGNOSIS — I10 Essential (primary) hypertension: Secondary | ICD-10-CM | POA: Diagnosis not present

## 2020-08-01 DIAGNOSIS — Z8673 Personal history of transient ischemic attack (TIA), and cerebral infarction without residual deficits: Secondary | ICD-10-CM | POA: Diagnosis not present

## 2020-08-01 DIAGNOSIS — R7303 Prediabetes: Secondary | ICD-10-CM | POA: Diagnosis not present

## 2020-08-01 DIAGNOSIS — E7800 Pure hypercholesterolemia, unspecified: Secondary | ICD-10-CM | POA: Diagnosis not present

## 2020-08-08 DIAGNOSIS — Z8673 Personal history of transient ischemic attack (TIA), and cerebral infarction without residual deficits: Secondary | ICD-10-CM | POA: Diagnosis not present

## 2020-08-08 DIAGNOSIS — R7303 Prediabetes: Secondary | ICD-10-CM | POA: Diagnosis not present

## 2020-08-08 DIAGNOSIS — Z79899 Other long term (current) drug therapy: Secondary | ICD-10-CM | POA: Diagnosis not present

## 2020-08-08 DIAGNOSIS — E7800 Pure hypercholesterolemia, unspecified: Secondary | ICD-10-CM | POA: Diagnosis not present

## 2020-08-08 DIAGNOSIS — I10 Essential (primary) hypertension: Secondary | ICD-10-CM | POA: Diagnosis not present

## 2020-08-08 DIAGNOSIS — R7989 Other specified abnormal findings of blood chemistry: Secondary | ICD-10-CM | POA: Diagnosis not present

## 2020-08-08 DIAGNOSIS — Z1211 Encounter for screening for malignant neoplasm of colon: Secondary | ICD-10-CM | POA: Diagnosis not present

## 2020-09-08 ENCOUNTER — Encounter: Payer: Self-pay | Admitting: Cardiovascular Disease

## 2020-09-08 ENCOUNTER — Ambulatory Visit (INDEPENDENT_AMBULATORY_CARE_PROVIDER_SITE_OTHER): Payer: Medicare Other | Admitting: Cardiovascular Disease

## 2020-09-08 ENCOUNTER — Other Ambulatory Visit: Payer: Self-pay

## 2020-09-08 VITALS — BP 130/72 | HR 68 | Ht 63.0 in | Wt 173.6 lb

## 2020-09-08 DIAGNOSIS — I10 Essential (primary) hypertension: Secondary | ICD-10-CM

## 2020-09-08 DIAGNOSIS — E7849 Other hyperlipidemia: Secondary | ICD-10-CM

## 2020-09-08 NOTE — Assessment & Plan Note (Signed)
History of hyperlipidemia on statin therapy with lipid profile performed by his PCP 08/01/2020 revealing total cholesterol 150, LDL of 92 and HDL 44.

## 2020-09-08 NOTE — Patient Instructions (Signed)
Medication Instructions:  Your physician recommends that you continue on your current medications as directed. Please refer to the Current Medication list given to you today.  *If you need a refill on your cardiac medications before your next appointment, please call your pharmacy*   Follow-Up: At [facility], you and your health needs are our priority.  As part of our continuing mission to provide you with exceptional heart care, we have created designated Provider Care Teams.  These Care Teams include your primary Cardiologist (physician) and Advanced Practice Providers (APPs -  Physician Assistants and Nurse Practitioners) who all work together to provide you with the care you need, when you need it.  We recommend signing up for the patient portal called "MyChart".  Sign up information is provided on this After Visit Summary.  MyChart is used to connect with patients for Virtual Visits (Telemedicine).  Patients are able to view lab/test results, encounter notes, upcoming appointments, etc.  Non-urgent messages can be sent to your provider as well.   To learn more about what you can do with MyChart, go to [email or web address].    Your next appointment:   No future appointments made at this time. We will see you on an as needed basis.  Provider:   Jonathan Berry, MD 

## 2020-09-08 NOTE — Progress Notes (Signed)
09/08/2020 Johnny and Johnny Sheppard   June 03, 1950  [id number]  Primary Physician Janie Morning, DO Primary Cardiologist: Lorretta Harp MD Renae Gloss  HPI:  Johnny Sheppard is a 70 y.o. mildly overweight married African-American male father of 2 children, grandfather 2 grandchildren who works as a Tax inspector and was referred back by his PCP, Dr. Janie Morning, could be reestablished.  I apparently last saw him back 8 to 10 years ago.  His aunt is Johnny Sheppard, also a patient of mine.  He has a history of treated hypertension hyperlipidemia.  There is no family history for heart disease.  He is never had a heart attack but did apparently have a stroke 2 to 3 years ago and is on Plavix.  He denies chest pain or shortness of breath.  When I saw him remotely he apparently had some atypical chest pain and a negative Myoview stress test.  He later thought this was related to a "pulled muscle".  Since I saw him he is remained stable and is totally asymptomatic.   Current Meds  Medication Sig  . acetaminophen (TYLENOL) 500 MG tablet Take 500-1,000 mg by mouth as needed for mild pain.  Marland Kitchen amLODipine (NORVASC) 10 MG tablet Take 10 mg by mouth daily.  . clopidogrel (PLAVIX) 75 MG tablet Take 75 mg by mouth daily.  . Multiple Vitamins-Minerals (AIRBORNE PO) Take 1 tablet by mouth daily.  . simvastatin (ZOCOR) 40 MG tablet Take 1 tablet (40 mg total) by mouth daily at 6 PM.     Allergies  Allergen Reactions  . Atorvastatin     Muscle/ skeletal pain     Social History   Socioeconomic History  . Marital status: Married    Spouse name: Not on file  . Number of children: Not on file  . Years of education: Not on file  . Highest education level: Not on file  Occupational History  . Not on file  Tobacco Use  . Smoking status: Former Smoker    Packs/day: 1.00    Years: 40.00    Pack years: 40.00    Types: Cigarettes    Quit date: 05/20/1994    Years since quitting:  26.3  . Smokeless tobacco: Never Used  Vaping Use  . Vaping Use: Never used  Substance and Sexual Activity  . Alcohol use: No    Alcohol/week: 0.0 standard drinks  . Drug use: No  . Sexual activity: Yes  Other Topics Concern  . Not on file  Social History Narrative  . Not on file   Social Determinants of Health   Financial Resource Strain: Not on file  Food Insecurity: Not on file  Transportation Needs: Not on file  Physical Activity: Not on file  Stress: Not on file  Social Connections: Not on file  Intimate Partner Violence: Not on file     Review of Systems: General: negative for chills, fever, night sweats or weight changes.  Cardiovascular: negative for chest pain, dyspnea on exertion, edema, orthopnea, palpitations, paroxysmal nocturnal dyspnea or shortness of breath Dermatological: negative for rash Respiratory: negative for cough or wheezing Urologic: negative for hematuria Abdominal: negative for nausea, vomiting, diarrhea, bright red blood per rectum, melena, or hematemesis Neurologic: negative for visual changes, syncope, or dizziness All other systems reviewed and are otherwise negative except as noted above.    Blood pressure 130/72, pulse 68, height 5\' 3"  (1.6 m), weight 173 lb 9.6 oz (78.7 kg).  General appearance: alert and no distress Neck: no adenopathy, no carotid bruit, no JVD, supple, symmetrical, trachea midline and thyroid not enlarged, symmetric, no tenderness/mass/nodules Lungs: clear to auscultation bilaterally Heart: regular rate and rhythm, S1, S2 normal, no murmur, click, rub or gallop Extremities: extremities normal, atraumatic, no cyanosis or edema Pulses: 2+ and symmetric Skin: Skin color, texture, turgor normal. No rashes or lesions Neurologic: Alert and oriented X 3, normal strength and tone. Normal symmetric reflexes. Normal coordination and gait  EKG sinus rhythm at 68 with nonspecific ST and T wave changes.  I personally reviewed  this EKG.  ASSESSMENT AND PLAN:   Hyperlipidemia History of hyperlipidemia on statin therapy with lipid profile performed by his PCP 08/01/2020 revealing total cholesterol 150, LDL of 92 and HDL 44.  Essential hypertension History of essential hypertension blood pressure measured today 130/72.  He is on amlodipine.      Lorretta Harp MD FACP,FACC,FAHA, [facility] 09/08/2020 4:18 PM

## 2020-09-08 NOTE — Assessment & Plan Note (Signed)
History of essential hypertension blood pressure measured today 130/72.  He is on amlodipine.

## 2020-12-05 DIAGNOSIS — Z03818 Encounter for observation for suspected exposure to other biological agents ruled out: Secondary | ICD-10-CM | POA: Diagnosis not present

## 2020-12-05 DIAGNOSIS — Z20822 Contact with and (suspected) exposure to covid-19: Secondary | ICD-10-CM | POA: Diagnosis not present

## 2020-12-28 ENCOUNTER — Ambulatory Visit (INDEPENDENT_AMBULATORY_CARE_PROVIDER_SITE_OTHER): Payer: Medicare Other | Admitting: Adult Health

## 2020-12-28 ENCOUNTER — Encounter: Payer: Self-pay | Admitting: Adult Health

## 2020-12-28 ENCOUNTER — Telehealth: Payer: Self-pay | Admitting: *Deleted

## 2020-12-28 VITALS — BP 135/76 | HR 75 | Ht 63.0 in | Wt 179.0 lb

## 2020-12-28 DIAGNOSIS — I6381 Other cerebral infarction due to occlusion or stenosis of small artery: Secondary | ICD-10-CM

## 2020-12-28 DIAGNOSIS — I639 Cerebral infarction, unspecified: Secondary | ICD-10-CM | POA: Diagnosis not present

## 2020-12-28 NOTE — Telephone Encounter (Signed)
Appt scheduled for today at 1245 to further discuss. Thank you

## 2020-12-28 NOTE — Patient Instructions (Signed)
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       Followup in the future with me in 1 year or call earlier if needed       Thank you for coming to see [place] at [facility]. I hope we have been able to provide you high quality care today.  You may receive a patient satisfaction survey over the next few weeks. We would appreciate your feedback and comments so that we may continue to improve ourselves and the health of our patients.

## 2020-12-28 NOTE — Telephone Encounter (Signed)
Received from [employer] Medical Examination, re: request letter from neurology- condition stable, safe to drive commercial motor vehicle. Form on NP's desk for review.

## 2020-12-28 NOTE — Progress Notes (Signed)
[facility] [address] [place]. [place] [address] [phone]       OFFICE FOLLOW UP NOTE  Mr. Johnny and [name] D Chavers Date of Birth:  [birth year] Medical Record Number:  [id number]   Reason for Referral:  hospital stroke follow up  CHIEF COMPLAINT:  Chief Complaint  Patient presents with   Follow-up    RM 3 alone Pt is well and stable.     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  He was previously seen 2 years ago and released from clinic as he was stable.  He has been doing well since prior visit without new stroke/TIA symptoms.  Reports residual mild left orbital numbness more noticeable when chewing but denies any other residual deficits.  Compliant on Plavix and simvastatin without side effects.  Blood pressures today 135/76.  No new concerns at this time.   History provided by reference purposes only update 01/05/2020 JM:: Johnny Sheppard is a 70 year old male who is being seen today for stroke follow-up.  He has been doing well from a neurological standpoint with residual deficits of intermittent mild left lip numbness.  He continues to work without difficulty.  Blood pressure today 118/71 and does monitor daily at home with typical levels 120/70s.  He continues on Plavix and atorvastatin for secondary stroke prevention without side effects.  Blood work obtained at prior visit with satisfactory lipid panel and A1c level. Surveillance monitoring of right ICA aneurysm stable as evidenced on CTA on 07/16/2018.  No further concerns at this time.  07/06/2018 update JM: Johnny Sheppard is a 70 year old male who returns today for routine follow-up visit after right thalamic infarct in 12/2017 and is accompanied by his wife.  He continues to do well from a stroke standpoint with residual deficits of occasional numbness in left hand finger tips and lips.  He has returned back to work full-time without  difficulties or complications.  He does endorse intermittent "tightness" sensation in his right occipital area without complaints of radiating pain into neck or up into head.  This sensation can last for couple minutes and typically occurs 4-5 times monthly.  This is been present since his stroke and denies any worsening.  He is questioning whether this could be a clot or blockage in 1 of his arteries and is fearful of a potential new stroke.  He does endorse history of cervical pain and has been told he has arthritis.  He continues on Plavix without side effects of bleeding or bruising.  Continues on atorvastatin without side effects myalgias.  Blood pressure today satisfactory at 110/62 but he does monitor at home and typically SBP 130s.  Denies new or worsening stroke/TIA symptoms.  HISTORY SUMMARY INITIAL VISIT 12/31/2017: Johnny Sheppard is being seen today for initial visit in the office for right thalamic infarct on 12/20/17. History obtained from patient and chart review. Reviewed all radiology images and labs personally.  Johnny Sheppard is a 70 y.o. male with a PMH of HTN, HLD, previously diabetic with resolution of abnormal Hgb A1C, remote tobacco use, >10 yrs, asthma, presented to the ER with complaint of left face, tongue, hand numbness, tingling/paresthesias, sensory changes. Patient states that at approximately 0730 this morning he was completing his ADL's and noticed that when he went to raise his hand to his mouth on the left side of his mouth, he felt a tingling sensation. Patient states that he "assumed  that asper cream accidentally got on his hand causing this sensation after touching his mouth." He also noticed sensory changes/ paraesthesias on his L hand, without weakness. Patient decided on helping neighbor with yard work without notice of weakness. Symptom did not resolve, prompting patient to present to urgent care. Patient was sent to ER for a stroke work-up. CT head reviewed and was  negative for acute, with mild cerebral atrophy and evidence of chronic small vessel disease noted.  MRI Brain reviewed and showed acute R thalamic infarct.  MRA head and neck was significant for a 2 mm right cavernous ICA aneurysm but negative for stenosis.  LDL 93 and recommended increase simvastatin dose to 40 mg daily.  Recommended aspirin and Plavix for 3 weeks and then Plavix alone as he was previously on aspirin 81 mg.  Due to minimal motor deficits and excellent functional status, patient was discharged with recommendations of outpatient PT and recommended outpatient echo and patient was discharged home in stable condition.  Patient is being seen today for hospital stroke follow-up and requested early appointment by patient to be released back to work.  He states he has very minimal numbness in his left fingertips and lips but this has been resolving.  He also states that his taste has been off but this is also been improving.  Patient did undergo echocardiogram on 12/25/2017 and showed an EF of 60 to 65% and negative for PFO.  He continues to take both aspirin and Plavix without side effects of bleeding or bruising.  Continues increased dose of simvastatin at 40 mg and denies side effects myalgias.  Patient has returned to doing all previous activities except for working.  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  He currently only does day trips with the longest run being down to [place].  He states if he is cleared by neurology and he will obtain DOT physical.  Blood pressure today satisfactory at 120/75.  It was requested during hospitalization for patient to undergo sleep apnea testing.  Patient states he does snore but otherwise sleeps well and denies daytime fatigue.  Denies being told about apneic events during the night by his wife.  Due to lack of symptoms for OSA, it was requested to hold off on sleep study at this time and patient was advised to call with any concerns  regarding possible symptoms of OSA.  Denies new or worsening stroke/TIA symptoms.    ROS:   14 system review of systems performed and negative with exception of see HPI  PMH:  Past Medical History:  Diagnosis Date   Arthritis    Asthma    H/O hematuria    Followed by urology   Hyperlipidemia    Hypertension     PSH:  Past Surgical History:  Procedure Laterality Date   COLON SURGERY     2012, had 1 polyp, Dr Benson [place]    Social History:  Social History   Socioeconomic History   Marital status: Married    Spouse name: Not on file   Number of children: Not on file   Years of education: Not on file   Highest education level: Not on file  Occupational History   Not on file  Tobacco Use   Smoking status: Former    Packs/day: 1.00    Years: 40.00    Pack years: 40.00    Types: Cigarettes    Quit date: 05/20/1994    Years since quitting: 26.6  Smokeless tobacco: Never  Vaping Use   Vaping Use: Never used  Substance and Sexual Activity   Alcohol use: No    Alcohol/week: 0.0 standard drinks   Drug use: No   Sexual activity: Yes  Other Topics Concern   Not on file  Social History Narrative   Not on file   Social Determinants of Health   Financial Resource Strain: Not on file  Food Insecurity: Not on file  Transportation Needs: Not on file  Physical Activity: Not on file  Stress: Not on file  Social Connections: Not on file  Intimate Partner Violence: Not on file    Family History:  Family History  Problem Relation Age of Onset   Hypertension Sister     Medications:   Current Outpatient Medications on File Prior to Visit  Medication Sig Dispense Refill   acetaminophen (TYLENOL) 500 MG tablet Take 500-1,000 mg by mouth as needed for mild pain.     amLODipine (NORVASC) 10 MG tablet Take 10 mg by mouth daily.     clopidogrel (PLAVIX) 75 MG tablet Take 75 mg by mouth daily.     Multiple Vitamins-Minerals (AIRBORNE PO) Take 1 tablet by mouth daily.      simvastatin (ZOCOR) 40 MG tablet Take 1 tablet (40 mg total) by mouth daily at 6 PM. (Patient taking differently: Take 40 mg by mouth at bedtime.) 90 tablet 3   No current facility-administered medications on file prior to visit.    Allergies:   Allergies  Allergen Reactions   Atorvastatin     Muscle/ skeletal pain      Physical Exam  Vitals:   12/28/20 1230  BP: 135/76  Pulse: 75  Weight: 179 lb (81.2 kg)  Height: '5\' 3"'$  (1.6 m)   Body mass index is 31.71 kg/m. No results found.  General: well developed, well nourished, pleasant middle-aged male, seated, in no evident distress Head: head normocephalic and atraumatic.   Neck: supple with no carotid or supraclavicular bruits Cardiovascular: regular rate and rhythm, no murmurs Musculoskeletal: no deformity; full neck ROM Skin:  no rash/petichiae Vascular:  Normal pulses all extremities  Neurologic Exam Mental Status: Awake and fully alert. Oriented to place and time. Recent and remote memory intact. Attention span, concentration and fund of knowledge appropriate. Mood and affect appropriate.  Cranial Nerves: Pupils equal, briskly reactive to light. Extraocular movements full without nystagmus. Visual fields full to confrontation. Hearing intact. Facial sensation intact. Face, tongue, palate moves normally and symmetrically.  Motor: Normal bulk and tone. Normal strength in all tested extremity muscles. Sensory.: intact to touch , pinprick , position and vibratory sensation.  Coordination: Rapid alternating movements normal in all extremities. Finger-to-nose and heel-to-shin performed accurately bilaterally. Gait and Station: Arises from chair without difficulty. Stance is normal. Gait demonstrates normal stride length and balance . Able to heel, toe and tandem walk without difficulty.  Reflexes: 1+ and symmetric. Toes downgoing.       Diagnostic Data (Labs, Imaging, Testing)  CT ANGIO HEAD  CT ANGIO  NECK 07/16/2018 IMPRESSION: 2 mm lateral out pouching of the right cavernous carotid consistent with a minimal atherosclerotic aneurysm. This is unlikely to be or become clinically significant.   Minimal atherosclerotic change of the aortic arch. No other atherosclerotic changes. Specifically, both carotid bifurcations are widely patent without visible soft or calcified plaque. Cervical internal carotid arteries are tortuous but widely patent.   Prominent anterior cervical osteophytes, suggesting diffuse idiopathic skeletal hyperostosis. These can be associated with  dysphagia.  Lipid panel 08/01/2020  Completed by PCP Total cholesterol 150, LDL 92, HDL 44     ASSESSMENT: Johnny Sheppard is a 71 y.o. year old male with right thalamic infarct on 12/20/2017 secondary to small vessel disease with residual mild left orbital numbness. Vascular risk factors include HTN and HLD.  Imaging also showed right ICA aneurysm with recent surveillance monitoring stable without changes.  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   Follow-up in 1 year or call earlier if needed   CC:  [facility] provider: Dr. Jerry Caras, Hinton Dyer, DO    Venancio Poisson, [facility]  [facility] [address] [place], [place] [address]  Phone [phone] Fax [phone]

## 2020-12-28 NOTE — Telephone Encounter (Signed)
Letter has been faxed to [facility], fax # [phone]. Confirmation received.

## 2021-01-16 DIAGNOSIS — U071 COVID-19: Secondary | ICD-10-CM | POA: Diagnosis not present

## 2021-01-18 DIAGNOSIS — Z03818 Encounter for observation for suspected exposure to other biological agents ruled out: Secondary | ICD-10-CM | POA: Diagnosis not present

## 2021-02-08 DIAGNOSIS — Z125 Encounter for screening for malignant neoplasm of prostate: Secondary | ICD-10-CM | POA: Diagnosis not present

## 2021-02-08 DIAGNOSIS — I10 Essential (primary) hypertension: Secondary | ICD-10-CM | POA: Diagnosis not present

## 2021-02-08 DIAGNOSIS — E7800 Pure hypercholesterolemia, unspecified: Secondary | ICD-10-CM | POA: Diagnosis not present

## 2021-02-08 DIAGNOSIS — R7989 Other specified abnormal findings of blood chemistry: Secondary | ICD-10-CM | POA: Diagnosis not present

## 2021-02-08 DIAGNOSIS — R7303 Prediabetes: Secondary | ICD-10-CM | POA: Diagnosis not present

## 2021-03-28 DIAGNOSIS — I10 Essential (primary) hypertension: Secondary | ICD-10-CM | POA: Diagnosis not present

## 2021-03-28 DIAGNOSIS — R7303 Prediabetes: Secondary | ICD-10-CM | POA: Diagnosis not present

## 2021-03-28 DIAGNOSIS — M25511 Pain in right shoulder: Secondary | ICD-10-CM | POA: Diagnosis not present

## 2021-03-28 DIAGNOSIS — E7800 Pure hypercholesterolemia, unspecified: Secondary | ICD-10-CM | POA: Diagnosis not present

## 2021-03-28 DIAGNOSIS — Z0000 Encounter for general adult medical examination without abnormal findings: Secondary | ICD-10-CM | POA: Diagnosis not present

## 2021-03-28 DIAGNOSIS — Z23 Encounter for immunization: Secondary | ICD-10-CM | POA: Diagnosis not present

## 2021-03-28 DIAGNOSIS — Z8673 Personal history of transient ischemic attack (TIA), and cerebral infarction without residual deficits: Secondary | ICD-10-CM | POA: Diagnosis not present

## 2021-03-28 DIAGNOSIS — M25512 Pain in left shoulder: Secondary | ICD-10-CM | POA: Diagnosis not present

## 2021-04-03 DIAGNOSIS — Z125 Encounter for screening for malignant neoplasm of prostate: Secondary | ICD-10-CM | POA: Diagnosis not present

## 2021-04-10 DIAGNOSIS — R3129 Other microscopic hematuria: Secondary | ICD-10-CM | POA: Diagnosis not present

## 2021-04-10 DIAGNOSIS — N401 Enlarged prostate with lower urinary tract symptoms: Secondary | ICD-10-CM | POA: Diagnosis not present

## 2021-04-10 DIAGNOSIS — R972 Elevated prostate specific antigen [PSA]: Secondary | ICD-10-CM | POA: Diagnosis not present

## 2021-04-10 DIAGNOSIS — R3915 Urgency of urination: Secondary | ICD-10-CM | POA: Diagnosis not present

## 2021-04-16 DIAGNOSIS — Z23 Encounter for immunization: Secondary | ICD-10-CM | POA: Diagnosis not present

## 2021-04-24 DIAGNOSIS — R972 Elevated prostate specific antigen [PSA]: Secondary | ICD-10-CM | POA: Diagnosis not present

## 2021-05-28 DIAGNOSIS — N5201 Erectile dysfunction due to arterial insufficiency: Secondary | ICD-10-CM | POA: Diagnosis not present

## 2021-05-28 DIAGNOSIS — R972 Elevated prostate specific antigen [PSA]: Secondary | ICD-10-CM | POA: Diagnosis not present

## 2021-05-28 DIAGNOSIS — R3129 Other microscopic hematuria: Secondary | ICD-10-CM | POA: Diagnosis not present

## 2021-05-28 DIAGNOSIS — R3915 Urgency of urination: Secondary | ICD-10-CM | POA: Diagnosis not present

## 2021-08-23 DIAGNOSIS — H109 Unspecified conjunctivitis: Secondary | ICD-10-CM | POA: Diagnosis not present

## 2021-09-06 DIAGNOSIS — H109 Unspecified conjunctivitis: Secondary | ICD-10-CM | POA: Diagnosis not present

## 2021-09-26 DIAGNOSIS — E7800 Pure hypercholesterolemia, unspecified: Secondary | ICD-10-CM | POA: Diagnosis not present

## 2021-09-26 DIAGNOSIS — R7303 Prediabetes: Secondary | ICD-10-CM | POA: Diagnosis not present

## 2021-09-26 DIAGNOSIS — I10 Essential (primary) hypertension: Secondary | ICD-10-CM | POA: Diagnosis not present

## 2021-10-03 DIAGNOSIS — Z8249 Family history of ischemic heart disease and other diseases of the circulatory system: Secondary | ICD-10-CM | POA: Diagnosis not present

## 2021-10-03 DIAGNOSIS — I10 Essential (primary) hypertension: Secondary | ICD-10-CM | POA: Diagnosis not present

## 2021-10-03 DIAGNOSIS — R7303 Prediabetes: Secondary | ICD-10-CM | POA: Diagnosis not present

## 2021-10-03 DIAGNOSIS — Z8673 Personal history of transient ischemic attack (TIA), and cerebral infarction without residual deficits: Secondary | ICD-10-CM | POA: Diagnosis not present

## 2021-10-03 DIAGNOSIS — E7800 Pure hypercholesterolemia, unspecified: Secondary | ICD-10-CM | POA: Diagnosis not present

## 2021-10-05 DIAGNOSIS — M7071 Other bursitis of hip, right hip: Secondary | ICD-10-CM | POA: Diagnosis not present

## 2021-11-08 ENCOUNTER — Ambulatory Visit (INDEPENDENT_AMBULATORY_CARE_PROVIDER_SITE_OTHER): Payer: Medicare Other | Admitting: Cardiovascular Disease

## 2021-11-08 ENCOUNTER — Encounter: Payer: Self-pay | Admitting: Cardiovascular Disease

## 2021-11-08 DIAGNOSIS — E782 Mixed hyperlipidemia: Secondary | ICD-10-CM

## 2021-11-08 DIAGNOSIS — I10 Essential (primary) hypertension: Secondary | ICD-10-CM | POA: Diagnosis not present

## 2021-11-08 NOTE — Assessment & Plan Note (Signed)
History of hyperlipidemia on statin therapy lipid profile performed 09/26/2021 revealing total cholesterol 180, LDL 92 and HDL 46.

## 2021-11-08 NOTE — Assessment & Plan Note (Signed)
History of essential hypertension a blood pressure measured today at 128/70.  He is on amlodipine.

## 2021-11-08 NOTE — Patient Instructions (Signed)
Medication Instructions:  ?Your physician recommends that you continue on your current medications as directed. Please refer to the Current Medication list given to you today.  ?*If you need a refill on your cardiac medications before your next appointment, please call your pharmacy* ? ? ?Lab Work: ?None ?If you have labs (blood work) drawn today and your tests are completely normal, you will receive your results only by: ?MyChart Message (if you have MyChart) OR ?A paper copy in the mail ?If you have any lab test that is abnormal or we need to change your treatment, we will call you to review the results. ? ? ?Testing/Procedures: ?None ? ? ?Follow-Up: ?At [facility], you and your health needs are our priority.  As part of our continuing mission to provide you with exceptional heart care, we have created designated Provider Care Teams.  These Care Teams include your primary Cardiologist (physician) and Advanced Practice Providers (APPs -  Physician Assistants and Nurse Practitioners) who all work together to provide you with the care you need, when you need it. ? ?We recommend signing up for the patient portal called "MyChart".  Sign up information is provided on this After Visit Summary.  MyChart is used to connect with patients for Virtual Visits (Telemedicine).  Patients are able to view lab/test results, encounter notes, upcoming appointments, etc.  Non-urgent messages can be sent to your provider as well.   ?To learn more about what you can do with MyChart, go to [email or web address].   ? ?Your next appointment:   ?1 year(s) ? ?The format for your next appointment:   ?In Person ? ?Provider:   ?Jonathan Berry, MD   ? ? ?Other Instructions ? ? ?Important Information About Sugar ? ? ? ? ?  ?

## 2021-11-08 NOTE — Progress Notes (Signed)
11/08/2021 Johnny Sheppard   [birth year]  [id number]  Primary Physician Janie Morning, DO Primary Cardiologist: Lorretta Harp MD Renae Gloss  HPI:  Johnny and [name] D Johnny Sheppard is a 71 y.o.  mildly overweight married African-American male father of 2 children, grandfather 2 grandchildren who works as a Tax inspector and was referred back by his PCP, Dr. Janie Morning, could be reestablished.  I apparently last saw him back 8 to 10 years ago.  His aunt is Johnny Sheppard, also a patient of mine.  I last saw him in the office 09/08/2020.  He has a history of treated hypertension hyperlipidemia.  There is no family history for heart disease.  He is never had a heart attack but did apparently have a stroke 2 to 3 years ago and is on Plavix.  He denies chest pain or shortness of breath.  When I saw him remotely he apparently had some atypical chest pain and a negative Myoview stress test.  He later thought this was related to a "pulled muscle".    Since I saw him a year ago he is remained stable.  He denies chest pain or shortness of breath.  Current Meds  Medication Sig   acetaminophen (TYLENOL) 500 MG tablet Take 500-1,000 mg by mouth as needed for mild pain.   amLODipine (NORVASC) 10 MG tablet Take 10 mg by mouth daily.   clopidogrel (PLAVIX) 75 MG tablet Take 75 mg by mouth daily.   Multiple Vitamins-Minerals (AIRBORNE PO) Take 1 tablet by mouth daily.   simvastatin (ZOCOR) 40 MG tablet Take 1 tablet (40 mg total) by mouth daily at 6 PM. (Patient taking differently: Take 40 mg by mouth at bedtime.)     Allergies  Allergen Reactions   Atorvastatin     Muscle/ skeletal pain     Social History   Socioeconomic History   Marital status: Married    Spouse name: Not on file   Number of children: Not on file   Years of education: Not on file   Highest education level: Not on file  Occupational History   Not on file  Tobacco Use   Smoking status: Former    Packs/day:  1.00    Years: 40.00    Total pack years: 40.00    Types: Cigarettes    Quit date: 05/20/1994    Years since quitting: 27.4   Smokeless tobacco: Never  Vaping Use   Vaping Use: Never used  Substance and Sexual Activity   Alcohol use: No    Alcohol/week: 0.0 standard drinks of alcohol   Drug use: No   Sexual activity: Yes  Other Topics Concern   Not on file  Social History Narrative   Not on file   Social Determinants of Health   Financial Resource Strain: Not on file  Food Insecurity: Not on file  Transportation Needs: Not on file  Physical Activity: Not on file  Stress: Not on file  Social Connections: Not on file  Intimate Partner Violence: Not on file     Review of Systems: General: negative for chills, fever, night sweats or weight changes.  Cardiovascular: negative for chest pain, dyspnea on exertion, edema, orthopnea, palpitations, paroxysmal nocturnal dyspnea or shortness of breath Dermatological: negative for rash Respiratory: negative for cough or wheezing Urologic: negative for hematuria Abdominal: negative for nausea, vomiting, diarrhea, bright red blood per rectum, melena, or hematemesis Neurologic: negative for visual changes, syncope, or dizziness All other systems  reviewed and are otherwise negative except as noted above.    Blood pressure 128/70, pulse (!) 57, height '5\' 3"'$  (1.6 m), weight 180 lb (81.6 kg).  General appearance: alert and no distress Neck: no adenopathy, no carotid bruit, no JVD, supple, symmetrical, trachea midline, and thyroid not enlarged, symmetric, no tenderness/mass/nodules Lungs: clear to auscultation bilaterally Heart: regular rate and rhythm, S1, S2 normal, no murmur, click, rub or gallop Extremities: extremities normal, atraumatic, no cyanosis or edema Pulses: 2+ and symmetric Skin: Skin color, texture, turgor normal. No rashes or lesions Neurologic: Grossly normal  EKG sinus bradycardia 57 without ST or T wave changes.  I  personally reviewed this EKG.  ASSESSMENT AND PLAN:   Hyperlipidemia History of hyperlipidemia on statin therapy lipid profile performed 09/26/2021 revealing total cholesterol 180, LDL 92 and HDL 46.  Essential hypertension History of essential hypertension a blood pressure measured today at 128/70.  He is on amlodipine.     Lorretta Harp MD FACP,FACC,FAHA, [facility] 11/08/2021 2:59 PM

## 2021-12-26 NOTE — Progress Notes (Unsigned)
[facility] [address] [place]. [place] [address] [phone]       OFFICE FOLLOW UP NOTE  Johnny Sheppard Date of Birth:  05-13-1951 Medical Record Number:  [id number]   Reason for Referral: stroke follow up  CHIEF COMPLAINT:  Chief Complaint  Patient presents with   Follow-up    RM 3 alone  Pt is well and stable, no new concerns     HPI:  Update 12/26/2021 JM: Patient returns for 1 year stroke follow-up.  Has been stable since prior visit without any new stroke/TIA symptoms.  Reports residual occasional left facial numbness.  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  Continues to maintain ADLs and IADLs independently.  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  He was previously seen 2 years ago and released from clinic as he was stable.  He has been doing well since prior visit without new stroke/TIA symptoms.  Reports residual mild left orbital numbness more noticeable when chewing but denies any other residual deficits.  Compliant on Plavix and simvastatin without side effects.  Blood pressures today 135/76.  No new concerns at this time.  update 01/05/2020 JM:: Johnny Sheppard is a 71 year old male who is being seen today for stroke follow-up.  He has been doing well from a neurological standpoint with residual deficits of intermittent mild left lip numbness.  He continues to work without difficulty.  Blood pressure today 118/71 and does monitor daily at home with typical levels 120/70s.  He continues on Plavix and atorvastatin for secondary stroke prevention without side effects.  Blood work obtained at prior visit with satisfactory lipid panel  and A1c level. Surveillance monitoring of right ICA aneurysm stable as evidenced on CTA on 07/16/2018.  No further concerns at this time.  07/06/2018 update JM: Johnny Sheppard is a 71 year old male who returns today for routine follow-up visit after right thalamic infarct in 12/2017 and is accompanied by his wife.  He continues to do well from a stroke standpoint with residual deficits of occasional numbness in left hand finger tips and lips.  He has returned back to work full-time without difficulties or complications.  He does endorse intermittent "tightness" sensation in his right occipital area without complaints of radiating pain into neck or up into head.  This sensation can last for couple minutes and typically occurs 4-5 times monthly.  This is been present since his stroke and denies any worsening.  He is questioning whether this could be a clot or blockage in 1 of his arteries and is fearful of a potential new stroke.  He does endorse history of cervical pain and has been told he has arthritis.  He continues on Plavix without side effects of bleeding or bruising.  Continues on atorvastatin without side effects myalgias.  Blood pressure today satisfactory at 110/62 but he does monitor at home and typically SBP 130s.  Denies new or worsening stroke/TIA symptoms.  HISTORY SUMMARY INITIAL VISIT 12/31/2017: Johnny Sheppard is being seen today for initial visit in the office for right thalamic infarct on 12/20/17. History obtained from patient and chart review. Reviewed all radiology images and labs personally.  Johnny Sheppard is a 71  y.o. male with a PMH of HTN, HLD, previously diabetic with resolution of abnormal Hgb A1C, remote tobacco use, >10 yrs, asthma, presented to the ER with complaint of left face, tongue, hand numbness, tingling/paresthesias, sensory changes. Patient states that at approximately 0730 this morning he was completing his ADL's and noticed that when he went to raise his hand to his mouth on  the left side of his mouth, he felt a tingling sensation. Patient states that he "assumed that asper cream accidentally got on his hand causing this sensation after touching his mouth." He also noticed sensory changes/ paraesthesias on his L hand, without weakness. Patient decided on helping neighbor with yard work without notice of weakness. Symptom did not resolve, prompting patient to present to urgent care. Patient was sent to ER for a stroke work-up. CT head reviewed and was negative for acute, with mild cerebral atrophy and evidence of chronic small vessel disease noted.  MRI Brain reviewed and showed acute R thalamic infarct.  MRA head and neck was significant for a 2 mm right cavernous ICA aneurysm but negative for stenosis.  LDL 93 and recommended increase simvastatin dose to 40 mg daily.  Recommended aspirin and Plavix for 3 weeks and then Plavix alone as he was previously on aspirin 81 mg.  Due to minimal motor deficits and excellent functional status, patient was discharged with recommendations of outpatient PT and recommended outpatient echo and patient was discharged home in stable condition.  Patient is being seen today for hospital stroke follow-up and requested early appointment by patient to be released back to work.  He states he has very minimal numbness in his left fingertips and lips but this has been resolving.  He also states that his taste has been off but this is also been improving.  Patient did undergo echocardiogram on 12/25/2017 and showed an EF of 60 to 65% and negative for PFO.  He continues to take both aspirin and Plavix without side effects of bleeding or bruising.  Continues increased dose of simvastatin at 40 mg and denies side effects myalgias.  Patient has returned to doing all previous activities except for working.  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  He currently only does day trips with the longest run being down to [place].  He states if he is  cleared by neurology and he will obtain DOT physical.  Blood pressure today satisfactory at 120/75.  It was requested during hospitalization for patient to undergo sleep apnea testing.  Patient states he does snore but otherwise sleeps well and denies daytime fatigue.  Denies being told about apneic events during the night by his wife.  Due to lack of symptoms for OSA, it was requested to hold off on sleep study at this time and patient was advised to call with any concerns regarding possible symptoms of OSA.  Denies new or worsening stroke/TIA symptoms.    ROS:   14 system review of systems performed and negative with exception of see HPI  PMH:  Past Medical History:  Diagnosis Date   Arthritis    Asthma    H/O hematuria    Followed by urology   Hyperlipidemia    Hypertension     PSH:  Past Surgical History:  Procedure Laterality Date   COLON SURGERY     2012, had 1 polyp, Dr Benson [place]    Social History:  Social History   Socioeconomic History   Marital status: Married  Spouse name: Not on file   Number of children: Not on file   Years of education: Not on file   Highest education level: Not on file  Occupational History   Not on file  Tobacco Use   Smoking status: Former    Packs/day: 1.00    Years: 40.00    Total pack years: 40.00    Types: Cigarettes    Quit date: 05/20/1994    Years since quitting: 27.6   Smokeless tobacco: Never  Vaping Use   Vaping Use: Never used  Substance and Sexual Activity   Alcohol use: No    Alcohol/week: 0.0 standard drinks of alcohol   Drug use: No   Sexual activity: Yes  Other Topics Concern   Not on file  Social History Narrative   Not on file   Social Determinants of Health   Financial Resource Strain: Not on file  Food Insecurity: Not on file  Transportation Needs: Not on file  Physical Activity: Not on file  Stress: Not on file  Social Connections: Not on file  Intimate Partner Violence: Not on file    Family History:   Family History  Problem Relation Age of Onset   Hypertension Sister     Medications:   Current Outpatient Medications on File Prior to Visit  Medication Sig Dispense Refill   acetaminophen (TYLENOL) 500 MG tablet Take 500-1,000 mg by mouth as needed for mild pain.     amLODipine (NORVASC) 10 MG tablet Take 10 mg by mouth daily.     clopidogrel (PLAVIX) 75 MG tablet Take 75 mg by mouth daily.     Multiple Vitamins-Minerals (AIRBORNE PO) Take 1 tablet by mouth daily.     simvastatin (ZOCOR) 40 MG tablet Take 1 tablet (40 mg total) by mouth daily at 6 PM. (Patient taking differently: Take 40 mg by mouth at bedtime.) 90 tablet 3   No current facility-administered medications on file prior to visit.    Allergies:   Allergies  Allergen Reactions   Atorvastatin     Muscle/ skeletal pain      Physical Exam  Vitals:   12/27/21 1234  BP: 123/67  Pulse: 71  Weight: 175 lb (79.4 kg)  Height: '5\' 3"'$  (1.6 m)    Body mass index is 31 kg/m. No results found.  General: well developed, well nourished, pleasant middle-aged male, seated, in no evident distress Head: head normocephalic and atraumatic.   Neck: supple with no carotid or supraclavicular bruits Cardiovascular: regular rate and rhythm, no murmurs Musculoskeletal: no deformity; full neck ROM Skin:  no rash/petichiae Vascular:  Normal pulses all extremities  Neurologic Exam Mental Status: Awake and fully alert. Oriented to place and time. Recent and remote memory intact. Attention span, concentration and fund of knowledge appropriate. Mood and affect appropriate.  Cranial Nerves: Pupils equal, briskly reactive to light. Extraocular movements full without nystagmus. Visual fields full to confrontation. Hearing intact. Facial sensation intact. Face, tongue, palate moves normally and symmetrically.  Motor: Normal bulk and tone. Normal strength in all tested extremity muscles. Sensory.: intact to touch , pinprick , position and  vibratory sensation.  Coordination: Rapid alternating movements normal in all extremities. Finger-to-nose and heel-to-shin performed accurately bilaterally. Gait and Station: Arises from chair without difficulty. Stance is normal. Gait demonstrates normal stride length and balance . Able to heel, toe and tandem walk without difficulty.  Reflexes: 1+ and symmetric. Toes downgoing.       Diagnostic Data (Labs, Imaging, Testing)  CT ANGIO HEAD  CT ANGIO NECK 07/16/2018 IMPRESSION: 2 mm lateral out pouching of the right cavernous carotid consistent with a minimal atherosclerotic aneurysm. This is unlikely to be or become clinically significant.   Minimal atherosclerotic change of the aortic arch. No other atherosclerotic changes. Specifically, both carotid bifurcations are widely patent without visible soft or calcified plaque. Cervical internal carotid arteries are tortuous but widely patent.   Prominent anterior cervical osteophytes, suggesting diffuse idiopathic skeletal hyperostosis. These can be associated with dysphagia.  Lipid panel 08/01/2020  Completed by PCP Total cholesterol 150, LDL 92, HDL 44     ASSESSMENT: Johnny Sheppard is a 71 y.o. year old male with right thalamic infarct on 12/20/2017 secondary to small vessel disease with residual mild occasional left orbital numbness. Vascular risk factors include HTN and HLD.  Imaging also showed right ICA aneurysm with recent surveillance monitoring stable without changes.      PLAN: -Continue clopidogrel 75 mg daily  and simvastatin for secondary stroke prevention -F/u with PCP regarding your HLD and HTN management  -Maintain strict control of hypertension with blood pressure goal below 130/90 and cholesterol with LDL cholesterol (bad cholesterol) goal below 70 mg/dL.   Doing well from stroke standpoint without further recommendations and risk factors are managed by PCP. He may follow up PRN, as usual for our patients who  are strictly being followed for stroke. If any new neurological issues should arise, request PCP place referral for evaluation by one of our neurologists. Thank you.     CC:  Janie Morning, DO    I spent 26 minutes of face-to-face and non-face-to-face time with patient.  This included previsit chart review, lab review, study review, order electronic health record documentation, patient education and discussion regarding history of prior strokes, secondary stroke prevention measures and aggressive stroke risk factor management and answered all the questions to patient satisfaction   Venancio Poisson, [facility]  [facility] [address] [place], [place] [address]  Phone [phone] Fax [phone]

## 2021-12-27 ENCOUNTER — Encounter: Payer: Self-pay | Admitting: Adult Health

## 2021-12-27 ENCOUNTER — Ambulatory Visit (INDEPENDENT_AMBULATORY_CARE_PROVIDER_SITE_OTHER): Payer: Medicare Other | Admitting: Adult Health

## 2021-12-27 VITALS — BP 123/67 | HR 71 | Ht 63.0 in | Wt 175.0 lb

## 2021-12-27 DIAGNOSIS — I6381 Other cerebral infarction due to occlusion or stenosis of small artery: Secondary | ICD-10-CM | POA: Diagnosis not present

## 2021-12-27 NOTE — Patient Instructions (Signed)
It was good to see you again today!  No changes at this time, continue routine follow up with Dr. Theda Sers for stroke risk factor management  As you have been doing well, you can follow up as needed at this time    [place] you feel better soon!

## 2022-01-03 ENCOUNTER — Ambulatory Visit: Payer: Medicare Other | Admitting: Adult Health

## 2022-01-09 DIAGNOSIS — G459 Transient cerebral ischemic attack, unspecified: Secondary | ICD-10-CM | POA: Diagnosis not present

## 2022-01-09 DIAGNOSIS — I10 Essential (primary) hypertension: Secondary | ICD-10-CM | POA: Diagnosis not present

## 2022-01-09 DIAGNOSIS — Z1211 Encounter for screening for malignant neoplasm of colon: Secondary | ICD-10-CM | POA: Diagnosis not present

## 2022-01-14 ENCOUNTER — Telehealth: Payer: Self-pay

## 2022-01-14 NOTE — Telephone Encounter (Signed)
Surgical Clearance from [facility] has been received, signed and faxed back. Interruption for Plavix.

## 2022-02-12 DIAGNOSIS — S92255A Nondisplaced fracture of navicular [scaphoid] of left foot, initial encounter for closed fracture: Secondary | ICD-10-CM | POA: Diagnosis not present

## 2022-02-19 DIAGNOSIS — D123 Benign neoplasm of transverse colon: Secondary | ICD-10-CM | POA: Diagnosis not present

## 2022-02-19 DIAGNOSIS — K635 Polyp of colon: Secondary | ICD-10-CM | POA: Diagnosis not present

## 2022-02-19 DIAGNOSIS — Z1211 Encounter for screening for malignant neoplasm of colon: Secondary | ICD-10-CM | POA: Diagnosis not present

## 2022-03-27 DIAGNOSIS — I10 Essential (primary) hypertension: Secondary | ICD-10-CM | POA: Diagnosis not present

## 2022-03-27 DIAGNOSIS — R7303 Prediabetes: Secondary | ICD-10-CM | POA: Diagnosis not present

## 2022-03-27 DIAGNOSIS — E7800 Pure hypercholesterolemia, unspecified: Secondary | ICD-10-CM | POA: Diagnosis not present

## 2022-03-27 DIAGNOSIS — Z125 Encounter for screening for malignant neoplasm of prostate: Secondary | ICD-10-CM | POA: Diagnosis not present

## 2022-03-27 DIAGNOSIS — Z8673 Personal history of transient ischemic attack (TIA), and cerebral infarction without residual deficits: Secondary | ICD-10-CM | POA: Diagnosis not present

## 2022-03-27 DIAGNOSIS — Z8249 Family history of ischemic heart disease and other diseases of the circulatory system: Secondary | ICD-10-CM | POA: Diagnosis not present

## 2022-03-28 DIAGNOSIS — Z8249 Family history of ischemic heart disease and other diseases of the circulatory system: Secondary | ICD-10-CM | POA: Diagnosis not present

## 2022-03-28 DIAGNOSIS — E7800 Pure hypercholesterolemia, unspecified: Secondary | ICD-10-CM | POA: Diagnosis not present

## 2022-03-28 DIAGNOSIS — Z8673 Personal history of transient ischemic attack (TIA), and cerebral infarction without residual deficits: Secondary | ICD-10-CM | POA: Diagnosis not present

## 2022-03-28 DIAGNOSIS — I10 Essential (primary) hypertension: Secondary | ICD-10-CM | POA: Diagnosis not present

## 2022-03-28 DIAGNOSIS — R7303 Prediabetes: Secondary | ICD-10-CM | POA: Diagnosis not present

## 2022-04-01 DIAGNOSIS — Z20818 Contact with and (suspected) exposure to other bacterial communicable diseases: Secondary | ICD-10-CM | POA: Diagnosis not present

## 2022-04-02 DIAGNOSIS — Z0000 Encounter for general adult medical examination without abnormal findings: Secondary | ICD-10-CM | POA: Diagnosis not present

## 2022-04-02 DIAGNOSIS — R7303 Prediabetes: Secondary | ICD-10-CM | POA: Diagnosis not present

## 2022-04-02 DIAGNOSIS — Z8673 Personal history of transient ischemic attack (TIA), and cerebral infarction without residual deficits: Secondary | ICD-10-CM | POA: Diagnosis not present

## 2022-04-02 DIAGNOSIS — M25511 Pain in right shoulder: Secondary | ICD-10-CM | POA: Diagnosis not present

## 2022-04-02 DIAGNOSIS — Z23 Encounter for immunization: Secondary | ICD-10-CM | POA: Diagnosis not present

## 2022-04-02 DIAGNOSIS — E7800 Pure hypercholesterolemia, unspecified: Secondary | ICD-10-CM | POA: Diagnosis not present

## 2022-04-02 DIAGNOSIS — I10 Essential (primary) hypertension: Secondary | ICD-10-CM | POA: Diagnosis not present

## 2022-07-02 DIAGNOSIS — Z8673 Personal history of transient ischemic attack (TIA), and cerebral infarction without residual deficits: Secondary | ICD-10-CM | POA: Diagnosis not present

## 2022-07-02 DIAGNOSIS — E7800 Pure hypercholesterolemia, unspecified: Secondary | ICD-10-CM | POA: Diagnosis not present

## 2022-07-02 DIAGNOSIS — I10 Essential (primary) hypertension: Secondary | ICD-10-CM | POA: Diagnosis not present

## 2022-07-02 DIAGNOSIS — R7303 Prediabetes: Secondary | ICD-10-CM | POA: Diagnosis not present

## 2022-07-09 DIAGNOSIS — I10 Essential (primary) hypertension: Secondary | ICD-10-CM | POA: Diagnosis not present

## 2022-07-09 DIAGNOSIS — R7303 Prediabetes: Secondary | ICD-10-CM | POA: Diagnosis not present

## 2022-07-09 DIAGNOSIS — Z8673 Personal history of transient ischemic attack (TIA), and cerebral infarction without residual deficits: Secondary | ICD-10-CM | POA: Diagnosis not present

## 2022-07-09 DIAGNOSIS — M19011 Primary osteoarthritis, right shoulder: Secondary | ICD-10-CM | POA: Diagnosis not present

## 2022-07-09 DIAGNOSIS — E7800 Pure hypercholesterolemia, unspecified: Secondary | ICD-10-CM | POA: Diagnosis not present

## 2022-07-09 DIAGNOSIS — L309 Dermatitis, unspecified: Secondary | ICD-10-CM | POA: Diagnosis not present

## 2022-10-08 DIAGNOSIS — I10 Essential (primary) hypertension: Secondary | ICD-10-CM | POA: Diagnosis not present

## 2022-10-08 DIAGNOSIS — M19011 Primary osteoarthritis, right shoulder: Secondary | ICD-10-CM | POA: Diagnosis not present

## 2022-10-08 DIAGNOSIS — R7303 Prediabetes: Secondary | ICD-10-CM | POA: Diagnosis not present

## 2022-10-21 DIAGNOSIS — M19011 Primary osteoarthritis, right shoulder: Secondary | ICD-10-CM | POA: Diagnosis not present

## 2022-10-21 DIAGNOSIS — Z125 Encounter for screening for malignant neoplasm of prostate: Secondary | ICD-10-CM | POA: Diagnosis not present

## 2022-10-21 DIAGNOSIS — E7800 Pure hypercholesterolemia, unspecified: Secondary | ICD-10-CM | POA: Diagnosis not present

## 2022-10-21 DIAGNOSIS — Z8673 Personal history of transient ischemic attack (TIA), and cerebral infarction without residual deficits: Secondary | ICD-10-CM | POA: Diagnosis not present

## 2022-10-21 DIAGNOSIS — M7989 Other specified soft tissue disorders: Secondary | ICD-10-CM | POA: Diagnosis not present

## 2022-10-21 DIAGNOSIS — Z79899 Other long term (current) drug therapy: Secondary | ICD-10-CM | POA: Diagnosis not present

## 2022-10-21 DIAGNOSIS — R7303 Prediabetes: Secondary | ICD-10-CM | POA: Diagnosis not present

## 2022-10-21 DIAGNOSIS — I10 Essential (primary) hypertension: Secondary | ICD-10-CM | POA: Diagnosis not present

## 2022-10-21 DIAGNOSIS — R7989 Other specified abnormal findings of blood chemistry: Secondary | ICD-10-CM | POA: Diagnosis not present

## 2023-01-04 DIAGNOSIS — M19041 Primary osteoarthritis, right hand: Secondary | ICD-10-CM | POA: Diagnosis not present

## 2023-01-04 DIAGNOSIS — M7702 Medial epicondylitis, left elbow: Secondary | ICD-10-CM | POA: Diagnosis not present

## 2023-01-04 DIAGNOSIS — M79641 Pain in right hand: Secondary | ICD-10-CM | POA: Diagnosis not present

## 2023-01-04 DIAGNOSIS — M25622 Stiffness of left elbow, not elsewhere classified: Secondary | ICD-10-CM | POA: Diagnosis not present

## 2023-02-05 DIAGNOSIS — Z23 Encounter for immunization: Secondary | ICD-10-CM | POA: Diagnosis not present

## 2023-02-18 DIAGNOSIS — R2231 Localized swelling, mass and lump, right upper limb: Secondary | ICD-10-CM | POA: Diagnosis not present

## 2023-04-02 DIAGNOSIS — M7662 Achilles tendinitis, left leg: Secondary | ICD-10-CM | POA: Diagnosis not present

## 2023-04-14 DIAGNOSIS — Z125 Encounter for screening for malignant neoplasm of prostate: Secondary | ICD-10-CM | POA: Diagnosis not present

## 2023-04-14 DIAGNOSIS — E7800 Pure hypercholesterolemia, unspecified: Secondary | ICD-10-CM | POA: Diagnosis not present

## 2023-04-14 DIAGNOSIS — I10 Essential (primary) hypertension: Secondary | ICD-10-CM | POA: Diagnosis not present

## 2023-04-14 DIAGNOSIS — R7303 Prediabetes: Secondary | ICD-10-CM | POA: Diagnosis not present

## 2023-04-14 DIAGNOSIS — Z79899 Other long term (current) drug therapy: Secondary | ICD-10-CM | POA: Diagnosis not present

## 2023-04-14 DIAGNOSIS — R7989 Other specified abnormal findings of blood chemistry: Secondary | ICD-10-CM | POA: Diagnosis not present

## 2023-04-21 DIAGNOSIS — D229 Melanocytic nevi, unspecified: Secondary | ICD-10-CM | POA: Diagnosis not present

## 2023-04-21 DIAGNOSIS — Z23 Encounter for immunization: Secondary | ICD-10-CM | POA: Diagnosis not present

## 2023-04-21 DIAGNOSIS — R7303 Prediabetes: Secondary | ICD-10-CM | POA: Diagnosis not present

## 2023-04-21 DIAGNOSIS — Z0000 Encounter for general adult medical examination without abnormal findings: Secondary | ICD-10-CM | POA: Diagnosis not present

## 2023-04-21 DIAGNOSIS — I10 Essential (primary) hypertension: Secondary | ICD-10-CM | POA: Diagnosis not present

## 2023-04-21 DIAGNOSIS — M7989 Other specified soft tissue disorders: Secondary | ICD-10-CM | POA: Diagnosis not present

## 2023-04-21 DIAGNOSIS — D72819 Decreased white blood cell count, unspecified: Secondary | ICD-10-CM | POA: Diagnosis not present

## 2023-04-21 DIAGNOSIS — Z8673 Personal history of transient ischemic attack (TIA), and cerebral infarction without residual deficits: Secondary | ICD-10-CM | POA: Diagnosis not present

## 2023-05-07 DIAGNOSIS — I10 Essential (primary) hypertension: Secondary | ICD-10-CM | POA: Diagnosis not present

## 2023-05-07 DIAGNOSIS — M7989 Other specified soft tissue disorders: Secondary | ICD-10-CM | POA: Diagnosis not present

## 2023-05-09 DIAGNOSIS — M13829 Other specified arthritis, unspecified elbow: Secondary | ICD-10-CM | POA: Diagnosis not present

## 2023-05-09 DIAGNOSIS — S5002XA Contusion of left elbow, initial encounter: Secondary | ICD-10-CM | POA: Diagnosis not present

## 2023-05-28 DIAGNOSIS — E7800 Pure hypercholesterolemia, unspecified: Secondary | ICD-10-CM | POA: Diagnosis not present

## 2023-05-28 DIAGNOSIS — R7303 Prediabetes: Secondary | ICD-10-CM | POA: Diagnosis not present

## 2023-05-28 DIAGNOSIS — Z79899 Other long term (current) drug therapy: Secondary | ICD-10-CM | POA: Diagnosis not present

## 2023-05-28 DIAGNOSIS — I10 Essential (primary) hypertension: Secondary | ICD-10-CM | POA: Diagnosis not present

## 2023-06-11 DIAGNOSIS — M25571 Pain in right ankle and joints of right foot: Secondary | ICD-10-CM | POA: Diagnosis not present

## 2023-06-11 DIAGNOSIS — M7989 Other specified soft tissue disorders: Secondary | ICD-10-CM | POA: Diagnosis not present

## 2023-06-11 DIAGNOSIS — M79641 Pain in right hand: Secondary | ICD-10-CM | POA: Diagnosis not present

## 2023-06-11 DIAGNOSIS — M79671 Pain in right foot: Secondary | ICD-10-CM | POA: Diagnosis not present

## 2023-06-11 DIAGNOSIS — M79672 Pain in left foot: Secondary | ICD-10-CM | POA: Diagnosis not present

## 2023-06-11 DIAGNOSIS — M25572 Pain in left ankle and joints of left foot: Secondary | ICD-10-CM | POA: Diagnosis not present

## 2023-06-11 DIAGNOSIS — M79642 Pain in left hand: Secondary | ICD-10-CM | POA: Diagnosis not present

## 2023-06-11 DIAGNOSIS — M1990 Unspecified osteoarthritis, unspecified site: Secondary | ICD-10-CM | POA: Diagnosis not present

## 2023-07-23 DIAGNOSIS — M79671 Pain in right foot: Secondary | ICD-10-CM | POA: Diagnosis not present

## 2023-07-23 DIAGNOSIS — M7989 Other specified soft tissue disorders: Secondary | ICD-10-CM | POA: Diagnosis not present

## 2023-07-23 DIAGNOSIS — M1990 Unspecified osteoarthritis, unspecified site: Secondary | ICD-10-CM | POA: Diagnosis not present

## 2023-07-23 DIAGNOSIS — M25572 Pain in left ankle and joints of left foot: Secondary | ICD-10-CM | POA: Diagnosis not present

## 2023-09-13 DIAGNOSIS — Z23 Encounter for immunization: Secondary | ICD-10-CM | POA: Diagnosis not present

## 2023-09-23 DIAGNOSIS — Z79899 Other long term (current) drug therapy: Secondary | ICD-10-CM | POA: Diagnosis not present

## 2023-09-23 DIAGNOSIS — Z125 Encounter for screening for malignant neoplasm of prostate: Secondary | ICD-10-CM | POA: Diagnosis not present

## 2023-09-23 DIAGNOSIS — I10 Essential (primary) hypertension: Secondary | ICD-10-CM | POA: Diagnosis not present

## 2023-09-23 DIAGNOSIS — E7800 Pure hypercholesterolemia, unspecified: Secondary | ICD-10-CM | POA: Diagnosis not present

## 2023-09-23 DIAGNOSIS — R7303 Prediabetes: Secondary | ICD-10-CM | POA: Diagnosis not present

## 2023-09-30 DIAGNOSIS — E7800 Pure hypercholesterolemia, unspecified: Secondary | ICD-10-CM | POA: Diagnosis not present

## 2023-09-30 DIAGNOSIS — I10 Essential (primary) hypertension: Secondary | ICD-10-CM | POA: Diagnosis not present

## 2023-09-30 DIAGNOSIS — M7989 Other specified soft tissue disorders: Secondary | ICD-10-CM | POA: Diagnosis not present

## 2023-09-30 DIAGNOSIS — Z8673 Personal history of transient ischemic attack (TIA), and cerebral infarction without residual deficits: Secondary | ICD-10-CM | POA: Diagnosis not present

## 2023-09-30 DIAGNOSIS — E1159 Type 2 diabetes mellitus with other circulatory complications: Secondary | ICD-10-CM | POA: Diagnosis not present

## 2023-12-24 ENCOUNTER — Encounter: Payer: Self-pay | Admitting: Cardiovascular Disease

## 2023-12-24 ENCOUNTER — Ambulatory Visit: Attending: Cardiovascular Disease | Admitting: Cardiovascular Disease

## 2023-12-24 VITALS — BP 132/67 | HR 79 | Ht 63.0 in | Wt 174.4 lb

## 2023-12-24 DIAGNOSIS — I10 Essential (primary) hypertension: Secondary | ICD-10-CM | POA: Insufficient documentation

## 2023-12-24 DIAGNOSIS — E782 Mixed hyperlipidemia: Secondary | ICD-10-CM | POA: Diagnosis not present

## 2023-12-24 NOTE — Patient Instructions (Signed)

## 2023-12-24 NOTE — Assessment & Plan Note (Signed)
 History of hyperlipidemia previously on simvastatin  recently changed to rosuvastatin by his PCP with lipid profile performed 04/14/2023 revealing total cholesterol 174, LDL of 95 and HDL of 60.

## 2023-12-24 NOTE — Assessment & Plan Note (Signed)
 History of essential hypertension with blood pressure measured today of 132/67.  He is on amlodipine , Micardis and Aldactone.

## 2023-12-24 NOTE — Progress Notes (Signed)
 12/24/2023 Johnny Sheppard   Sep 02, 1950  [id number]  Primary Physician Gerome Brunet, DO Primary Cardiologist: Johnny JINNY Lesches MD Johnny Sheppard  HPI:  Johnny [place] D Baldinger is a 73 y.o.   mildly overweight married African-American male father of 2 children, grandfather 2 grandchildren who works as a Barista and was referred back by his PCP, Dr. Brunet Gerome, could be reestablished.  I apparently last saw him back 8 to 10 years ago.  His aunt is Johnny Sheppard, also a patient of mine.  I last saw him in the office 11/08/2021.  He has a history of treated hypertension hyperlipidemia.  There is no family history for heart disease.  He is never had a heart attack but did apparently have a stroke 2 to 3 years ago and is on Plavix .  He denies chest pain or shortness of breath.  When I saw him remotely he apparently had some atypical chest pain and a negative Myoview stress test.  He later thought this was related to a pulled muscle.     Since I saw him 2 years ago he continues to do well.  He had 1 episode several weeks ago of chest pain with left upper extremity radiation but that was his only episode in the last 2 years.     Current Meds  Medication Sig   acetaminophen  (TYLENOL ) 500 MG tablet Take 500-1,000 mg by mouth as needed for mild pain.   amLODipine  (NORVASC ) 10 MG tablet Take 10 mg by mouth daily.   clopidogrel  (PLAVIX ) 75 MG tablet Take 75 mg by mouth daily.   Multiple Vitamins-Minerals (AIRBORNE PO) Take 1 tablet by mouth daily.   rosuvastatin (CRESTOR) 10 MG tablet Take 10 mg by mouth 3 (three) times daily. (Patient taking differently: Take 10 mg by mouth 3 (three) times a week.)   spironolactone (ALDACTONE) 50 MG tablet Take 50 mg by mouth daily.   telmisartan (MICARDIS) 80 MG tablet Take 80 mg by mouth daily.     Allergies  Allergen Reactions   Atorvastatin     Muscle/ skeletal pain     Social History   Socioeconomic History   Marital  status: Married    Spouse name: Not on file   Number of children: Not on file   Years of education: Not on file   Highest education level: Not on file  Occupational History   Not on file  Tobacco Use   Smoking status: Former    Current packs/day: 0.00    Average packs/day: 1 pack/day for 40.0 years (40.0 ttl pk-yrs)    Types: Cigarettes    Start date: 05/20/1954    Quit date: 05/20/1994    Years since quitting: 29.6   Smokeless tobacco: Never  Vaping Use   Vaping status: Never Used  Substance and Sexual Activity   Alcohol use: No    Alcohol/week: 0.0 standard drinks of alcohol   Drug use: No   Sexual activity: Yes  Other Topics Concern   Not on file  Social History Narrative   Not on file   Social Drivers of Health   Financial Resource Strain: Not on file  Food Insecurity: Not on file  Transportation Needs: Not on file  Physical Activity: Not on file  Stress: Not on file  Social Connections: Unknown (10/02/2021)   Received from [facility]   Social Network    Social Network: Not on file  Intimate Partner Violence: Unknown (08/24/2021)  Received from [facility]   HITS    Physically Hurt: Not on file    Insult or Talk Down To: Not on file    Threaten Physical Harm: Not on file    Scream or Curse: Not on file     Review of Systems: General: negative for chills, fever, night sweats or weight changes.  Cardiovascular: negative for chest pain, dyspnea on exertion, edema, orthopnea, palpitations, paroxysmal nocturnal dyspnea or shortness of breath Dermatological: negative for rash Respiratory: negative for cough or wheezing Urologic: negative for hematuria Abdominal: negative for nausea, vomiting, diarrhea, bright red blood per rectum, melena, or hematemesis Neurologic: negative for visual changes, syncope, or dizziness All other systems reviewed and are otherwise negative except as noted above.    Blood pressure 132/67, pulse 79, height 5' 3 (1.6 m), weight 174  lb 6.4 oz (79.1 kg), SpO2 97%.  General appearance: alert and no distress Neck: no adenopathy, no carotid bruit, no JVD, supple, symmetrical, trachea midline, and thyroid  not enlarged, symmetric, no tenderness/mass/nodules Lungs: clear to auscultation bilaterally Heart: regular rate and rhythm, S1, S2 normal, no murmur, click, rub or gallop Extremities: extremities normal, atraumatic, no cyanosis or edema Pulses: 2+ and symmetric Skin: Skin color, texture, turgor normal. No rashes or lesions Neurologic: Grossly normal  EKG EKG Interpretation Date/Time:  Wednesday December 24 2023 14:51:34 EDT Ventricular Rate:  74 PR Interval:  200 QRS Duration:  92 QT Interval:  382 QTC Calculation: 424 R Axis:   56  Text Interpretation: Sinus rhythm with marked sinus arrhythmia Nonspecific T wave abnormality When compared with ECG of 20-Dec-2017 12:09, Nonspecific T wave abnormality now evident in Anterior leads Confirmed by Court Carrier [phone]) on 12/24/2023 2:54:34 PM    ASSESSMENT AND PLAN:   Hyperlipidemia History of hyperlipidemia previously on simvastatin  recently changed to rosuvastatin by his PCP with lipid profile performed 04/14/2023 revealing total cholesterol 174, LDL of 95 and HDL of 60.  Essential hypertension History of essential hypertension with blood pressure measured today of 132/67.  He is on amlodipine , Micardis and Aldactone.     Carrier DOROTHA Court MD FACP,FACC,FAHA, [facility] 12/24/2023 3:05 PM

## 2024-01-26 DIAGNOSIS — E1159 Type 2 diabetes mellitus with other circulatory complications: Secondary | ICD-10-CM | POA: Diagnosis not present

## 2024-01-26 DIAGNOSIS — M7989 Other specified soft tissue disorders: Secondary | ICD-10-CM | POA: Diagnosis not present

## 2024-01-26 DIAGNOSIS — I10 Essential (primary) hypertension: Secondary | ICD-10-CM | POA: Diagnosis not present

## 2024-01-26 DIAGNOSIS — E7800 Pure hypercholesterolemia, unspecified: Secondary | ICD-10-CM | POA: Diagnosis not present

## 2024-02-04 DIAGNOSIS — M545 Low back pain, unspecified: Secondary | ICD-10-CM | POA: Diagnosis not present

## 2024-02-04 DIAGNOSIS — M7989 Other specified soft tissue disorders: Secondary | ICD-10-CM | POA: Diagnosis not present

## 2024-02-04 DIAGNOSIS — M1990 Unspecified osteoarthritis, unspecified site: Secondary | ICD-10-CM | POA: Diagnosis not present

## 2024-02-04 DIAGNOSIS — Z23 Encounter for immunization: Secondary | ICD-10-CM | POA: Diagnosis not present

## 2024-02-18 DIAGNOSIS — I10 Essential (primary) hypertension: Secondary | ICD-10-CM | POA: Diagnosis not present

## 2024-02-18 DIAGNOSIS — M7989 Other specified soft tissue disorders: Secondary | ICD-10-CM | POA: Diagnosis not present

## 2024-02-18 DIAGNOSIS — Z8673 Personal history of transient ischemic attack (TIA), and cerebral infarction without residual deficits: Secondary | ICD-10-CM | POA: Diagnosis not present

## 2024-02-18 DIAGNOSIS — R7989 Other specified abnormal findings of blood chemistry: Secondary | ICD-10-CM | POA: Diagnosis not present

## 2024-02-18 DIAGNOSIS — E559 Vitamin D deficiency, unspecified: Secondary | ICD-10-CM | POA: Diagnosis not present

## 2024-02-18 DIAGNOSIS — Z23 Encounter for immunization: Secondary | ICD-10-CM | POA: Diagnosis not present

## 2024-02-18 DIAGNOSIS — R82998 Other abnormal findings in urine: Secondary | ICD-10-CM | POA: Diagnosis not present

## 2024-02-18 DIAGNOSIS — E1159 Type 2 diabetes mellitus with other circulatory complications: Secondary | ICD-10-CM | POA: Diagnosis not present

## 2024-02-18 DIAGNOSIS — E7800 Pure hypercholesterolemia, unspecified: Secondary | ICD-10-CM | POA: Diagnosis not present
